# Patient Record
Sex: Male | Born: 1950 | Race: White | Hispanic: No | Marital: Married | Smoking: Never smoker
Health system: Southern US, Community
[De-identification: ages and names within clinical notes are randomized; demographics above are authoritative.]

## PROBLEM LIST (undated history)

## (undated) DIAGNOSIS — F419 Anxiety disorder, unspecified: Secondary | ICD-10-CM

## (undated) DIAGNOSIS — I10 Essential (primary) hypertension: Secondary | ICD-10-CM

## (undated) DIAGNOSIS — G629 Polyneuropathy, unspecified: Secondary | ICD-10-CM

## (undated) DIAGNOSIS — M5030 Other cervical disc degeneration, unspecified cervical region: Secondary | ICD-10-CM

## (undated) DIAGNOSIS — N189 Chronic kidney disease, unspecified: Secondary | ICD-10-CM

## (undated) DIAGNOSIS — D649 Anemia, unspecified: Secondary | ICD-10-CM

## (undated) DIAGNOSIS — R7303 Prediabetes: Secondary | ICD-10-CM

## (undated) DIAGNOSIS — E785 Hyperlipidemia, unspecified: Secondary | ICD-10-CM

## (undated) DIAGNOSIS — K760 Fatty (change of) liver, not elsewhere classified: Secondary | ICD-10-CM

## (undated) DIAGNOSIS — G4700 Insomnia, unspecified: Secondary | ICD-10-CM

## (undated) DIAGNOSIS — M109 Gout, unspecified: Secondary | ICD-10-CM

## (undated) HISTORY — DX: Insomnia, unspecified: G47.00

## (undated) HISTORY — DX: Other cervical disc degeneration, unspecified cervical region: M50.30

## (undated) HISTORY — DX: Polyneuropathy, unspecified: G62.9

---

## 2011-05-15 ENCOUNTER — Emergency Department (HOSPITAL_COMMUNITY): Payer: No Typology Code available for payment source

## 2011-05-15 ENCOUNTER — Emergency Department (HOSPITAL_COMMUNITY)
Admission: EM | Admit: 2011-05-15 | Discharge: 2011-05-15 | Disposition: A | Discharge status: Home / Self Care | Payer: No Typology Code available for payment source | Attending: Emergency Medicine | Admitting: Emergency Medicine

## 2011-05-15 ENCOUNTER — Emergency Department (HOSPITAL_COMMUNITY)
Admission: EM | Admit: 2011-05-15 | Discharge: 2011-05-16 | Disposition: A | Discharge status: Home / Self Care | Payer: No Typology Code available for payment source | Attending: Emergency Medicine | Admitting: Emergency Medicine

## 2011-05-15 DIAGNOSIS — S0180XA Unspecified open wound of other part of head, initial encounter: Secondary | ICD-10-CM | POA: Insufficient documentation

## 2011-05-15 DIAGNOSIS — S20219A Contusion of unspecified front wall of thorax, initial encounter: Secondary | ICD-10-CM | POA: Insufficient documentation

## 2011-05-15 DIAGNOSIS — R0789 Other chest pain: Secondary | ICD-10-CM | POA: Insufficient documentation

## 2011-05-15 DIAGNOSIS — I10 Essential (primary) hypertension: Secondary | ICD-10-CM | POA: Insufficient documentation

## 2011-05-15 DIAGNOSIS — M546 Pain in thoracic spine: Secondary | ICD-10-CM | POA: Insufficient documentation
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
## 2011-05-16 ENCOUNTER — Emergency Department (HOSPITAL_COMMUNITY)
Admission: EM | Admit: 2011-05-16 | Discharge: 2011-05-16 | Disposition: A | Discharge status: Home / Self Care | Payer: No Typology Code available for payment source | Source: Home / Self Care | Attending: Emergency Medicine | Admitting: Emergency Medicine

## 2011-05-16 DIAGNOSIS — I10 Essential (primary) hypertension: Secondary | ICD-10-CM | POA: Insufficient documentation

## 2011-05-16 DIAGNOSIS — Z09 Encounter for follow-up examination after completed treatment for conditions other than malignant neoplasm: Secondary | ICD-10-CM | POA: Insufficient documentation

## 2011-05-16 DIAGNOSIS — D699 Hemorrhagic condition, unspecified: Secondary | ICD-10-CM | POA: Insufficient documentation

## 2011-05-16 DIAGNOSIS — S0180XA Unspecified open wound of other part of head, initial encounter: Secondary | ICD-10-CM | POA: Insufficient documentation

## 2011-05-18 ENCOUNTER — Emergency Department (HOSPITAL_COMMUNITY)
Admission: EM | Admit: 2011-05-18 | Discharge: 2011-05-18 | Disposition: A | Discharge status: Home / Self Care | Payer: No Typology Code available for payment source | Attending: Emergency Medicine | Admitting: Emergency Medicine

## 2011-05-18 DIAGNOSIS — I10 Essential (primary) hypertension: Secondary | ICD-10-CM | POA: Insufficient documentation

## 2011-05-18 DIAGNOSIS — M549 Dorsalgia, unspecified: Secondary | ICD-10-CM | POA: Insufficient documentation

## 2011-05-18 DIAGNOSIS — S0510XA Contusion of eyeball and orbital tissues, unspecified eye, initial encounter: Secondary | ICD-10-CM | POA: Insufficient documentation

## 2011-05-18 DIAGNOSIS — M542 Cervicalgia: Secondary | ICD-10-CM | POA: Insufficient documentation

## 2011-05-18 DIAGNOSIS — Z09 Encounter for follow-up examination after completed treatment for conditions other than malignant neoplasm: Secondary | ICD-10-CM | POA: Insufficient documentation

## 2013-11-14 HISTORY — PX: KNEE SURGERY: SHX244

## 2015-11-11 ENCOUNTER — Ambulatory Visit: Payer: Self-pay | Admitting: *Deleted

## 2019-02-22 ENCOUNTER — Other Ambulatory Visit: Payer: Self-pay | Admitting: Nephrology

## 2019-02-22 DIAGNOSIS — N183 Chronic kidney disease, stage 3 unspecified: Secondary | ICD-10-CM

## 2019-03-27 ENCOUNTER — Ambulatory Visit
Admission: RE | Admit: 2019-03-27 | Discharge: 2019-03-27 | Disposition: A | Discharge status: Home / Self Care | Payer: Medicare Other | Source: Ambulatory Visit | Attending: Nephrology | Admitting: Nephrology

## 2019-03-27 DIAGNOSIS — N183 Chronic kidney disease, stage 3 unspecified: Secondary | ICD-10-CM

## 2019-03-27 IMAGING — US US RENAL
1 series · 14 of 25 positions shown · non-contrast
Comparison: None.

CLINICAL DATA: Chronic kidney disease, hypertension

EXAM:
RENAL / URINARY TRACT ULTRASOUND COMPLETE

[Series 1: us renal · 0.20mm/px · 14 of 35 slices shown]
[im 1/35]
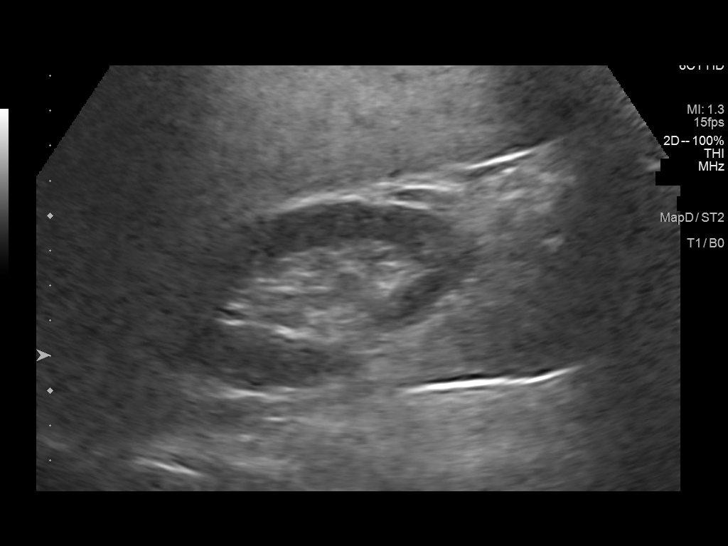
[im 3/35]
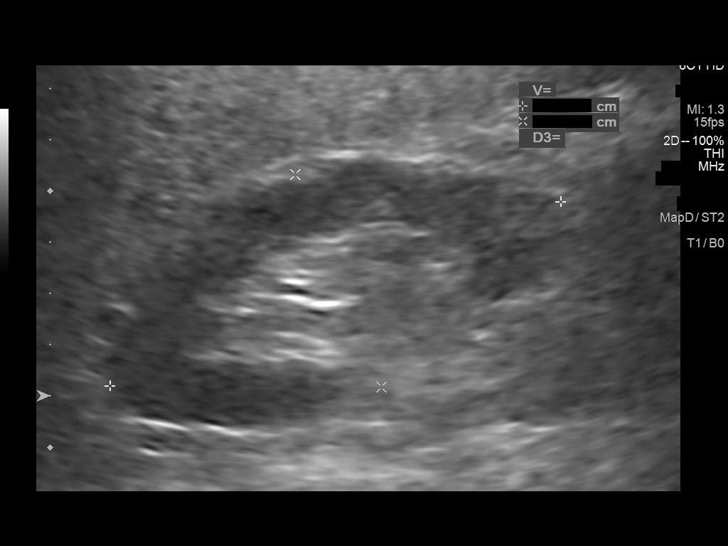
[im 6/35]
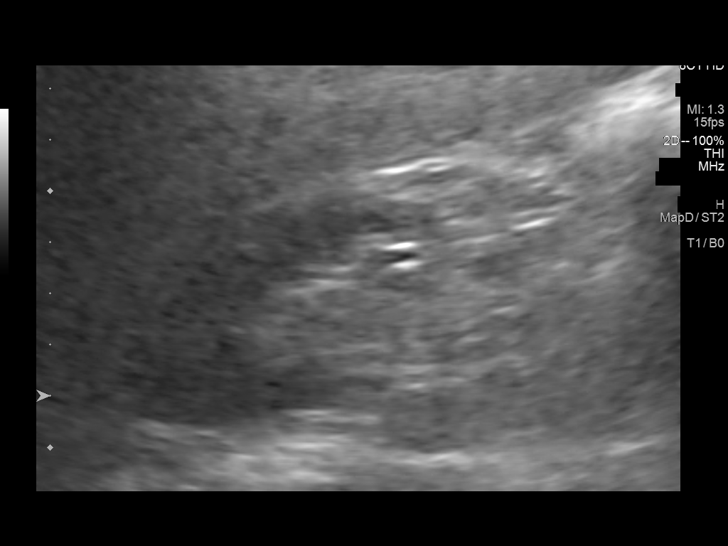
[im 9/35]
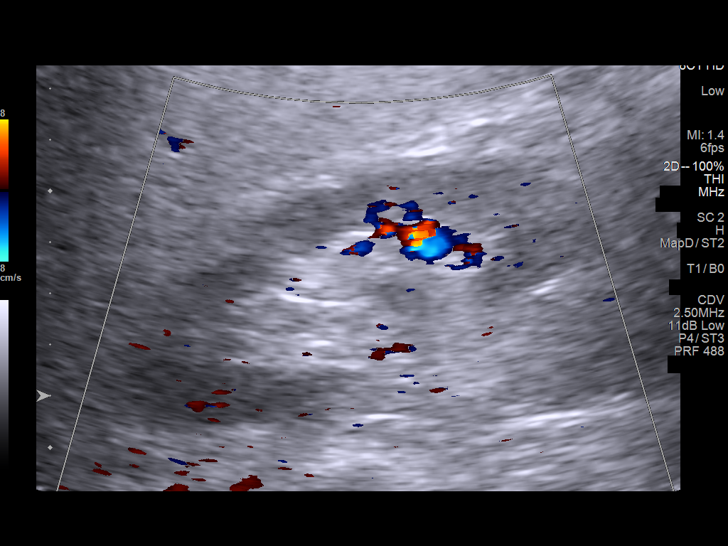
[im 12/35]
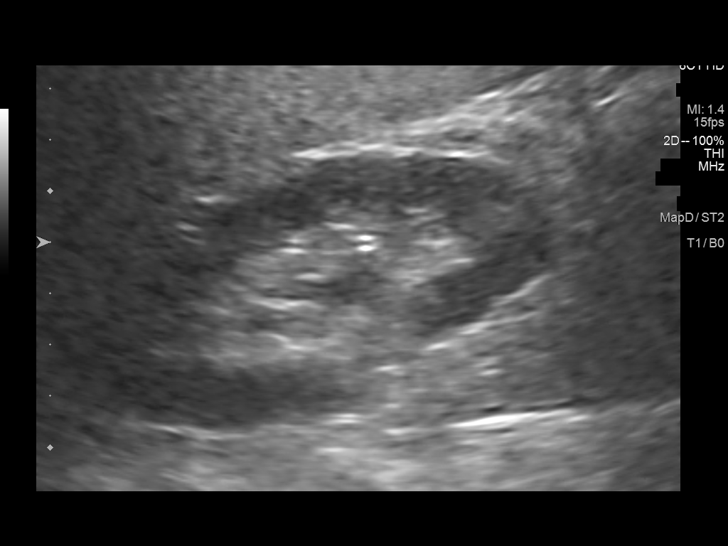
[im 13/35]
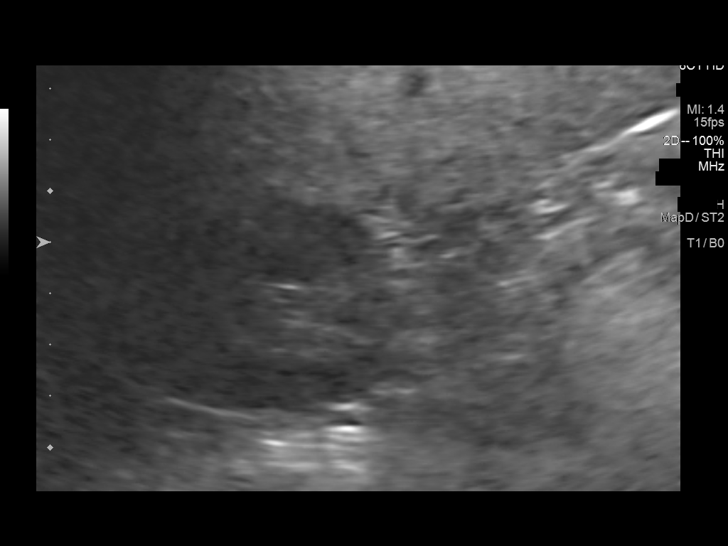
[im 16/35]
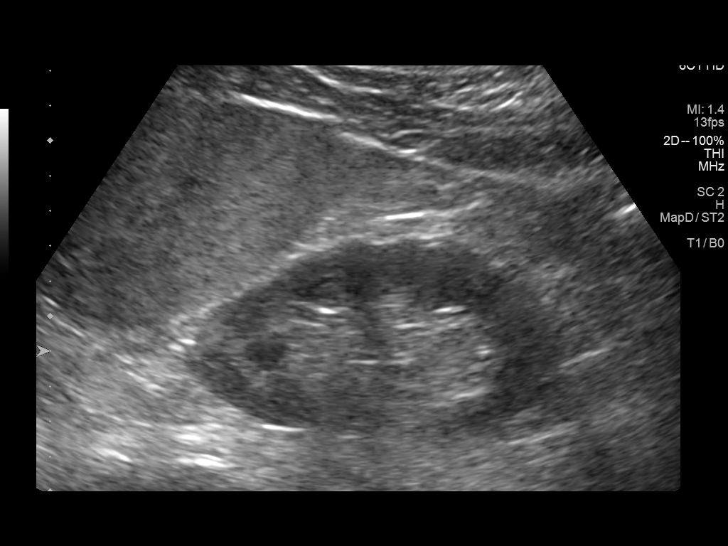
[im 19/35]
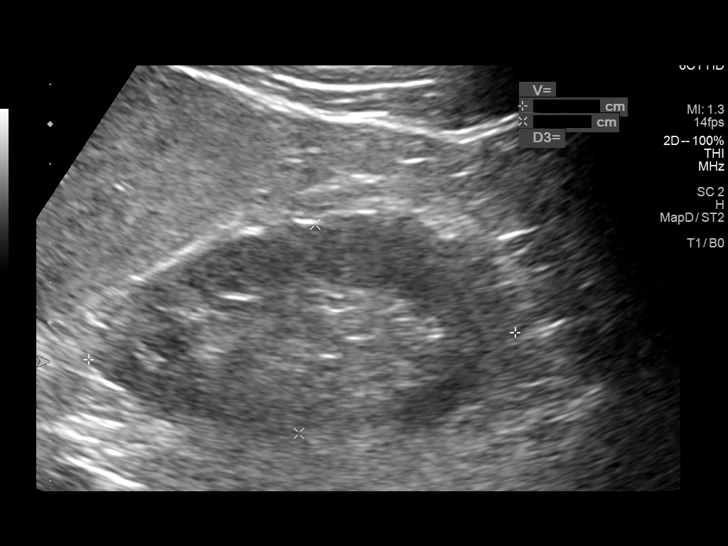
[im 22/35]
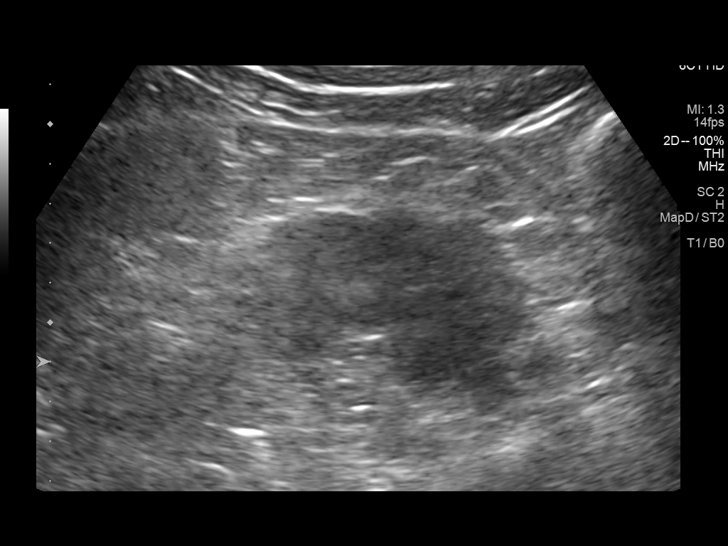
[im 23/35]
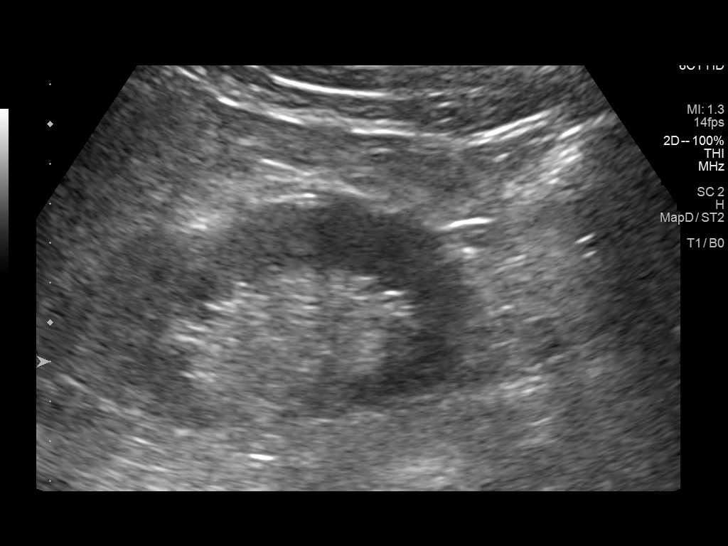
[im 26/35]
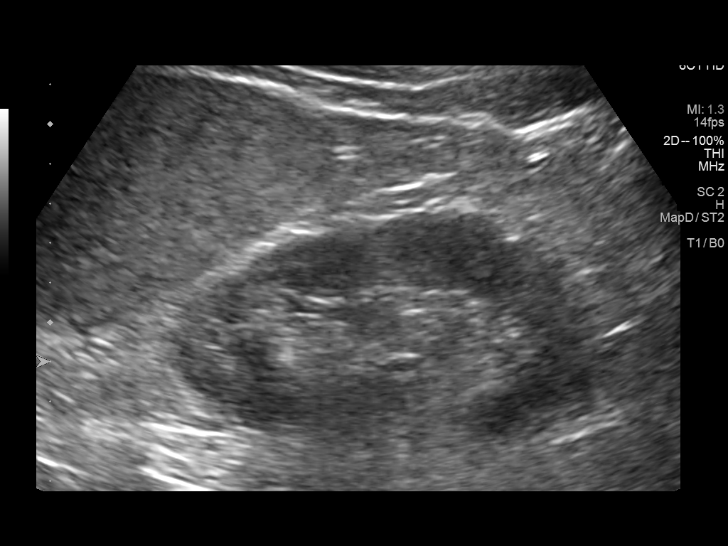
[im 29/35]
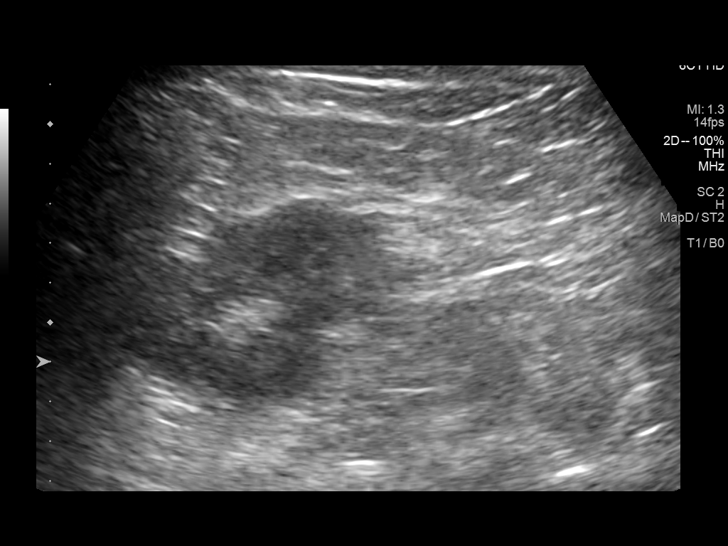
[im 32/35]
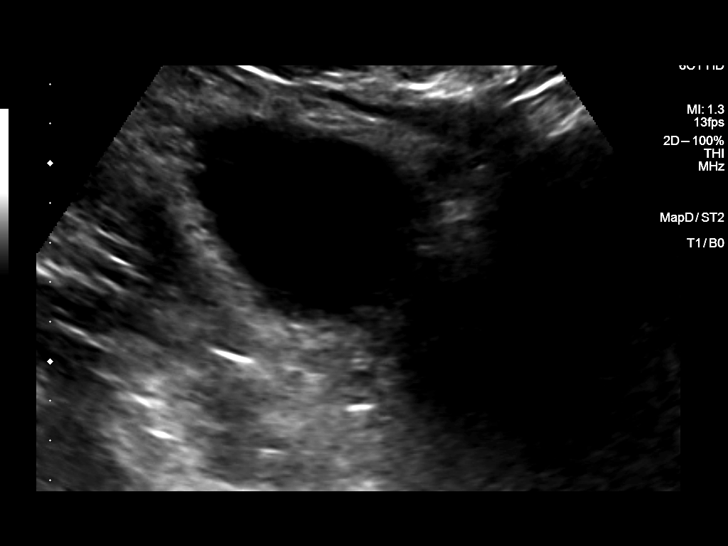
[im 35/35]
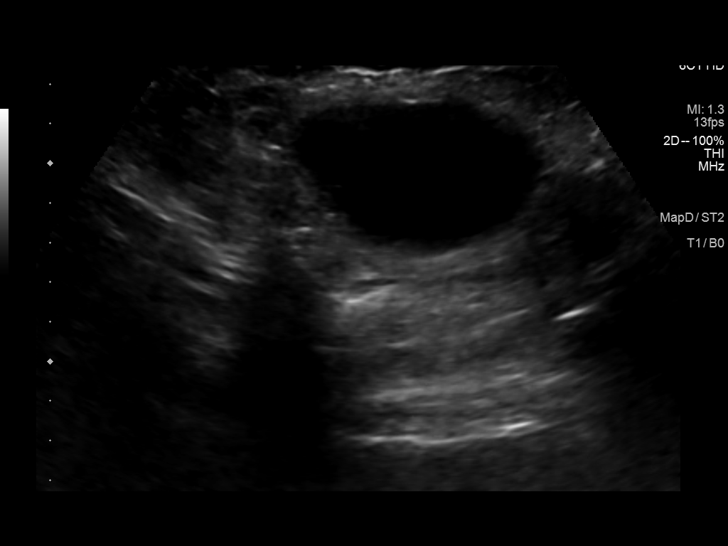

[14 of 25 positions shown; findings below may reference images not displayed]

FINDINGS: Right Kidney:

Renal measurements: 9.5 x 4.5 x 4.5 cm = volume: 101 mL .
Echogenicity within normal limits. No mass or hydronephrosis
visualized.

Left Kidney:

Renal measurements: 10.8 x 5.3 x 5.3 cm = volume: 157 mL.
Echogenicity within normal limits. No mass or hydronephrosis
visualized.

Bladder:

Appears normal for degree of bladder distention.
IMPRESSION: Normal renal ultrasound for age

## 2020-06-23 ENCOUNTER — Other Ambulatory Visit: Payer: Self-pay | Admitting: Orthopedic Surgery

## 2020-06-23 DIAGNOSIS — M7541 Impingement syndrome of right shoulder: Secondary | ICD-10-CM

## 2020-06-23 DIAGNOSIS — M25511 Pain in right shoulder: Secondary | ICD-10-CM

## 2020-06-26 ENCOUNTER — Other Ambulatory Visit: Payer: Self-pay

## 2020-06-26 ENCOUNTER — Ambulatory Visit
Admission: RE | Admit: 2020-06-26 | Discharge: 2020-06-26 | Disposition: A | Discharge status: Home / Self Care | Payer: Medicare Other | Source: Ambulatory Visit | Attending: Orthopedic Surgery | Admitting: Orthopedic Surgery

## 2020-06-26 DIAGNOSIS — M25511 Pain in right shoulder: Secondary | ICD-10-CM

## 2020-06-26 DIAGNOSIS — M7541 Impingement syndrome of right shoulder: Secondary | ICD-10-CM

## 2020-06-26 IMAGING — CR DG ORBITS FOR FOREIGN BODY
2 series · 2 of 2 positions shown · non-contrast
Comparison: None.

CLINICAL DATA: Metal working/exposure; clearance prior to MRI

EXAM:
ORBITS FOR FOREIGN BODY - 2 VIEW

[w orbit pa (1 of 2)]
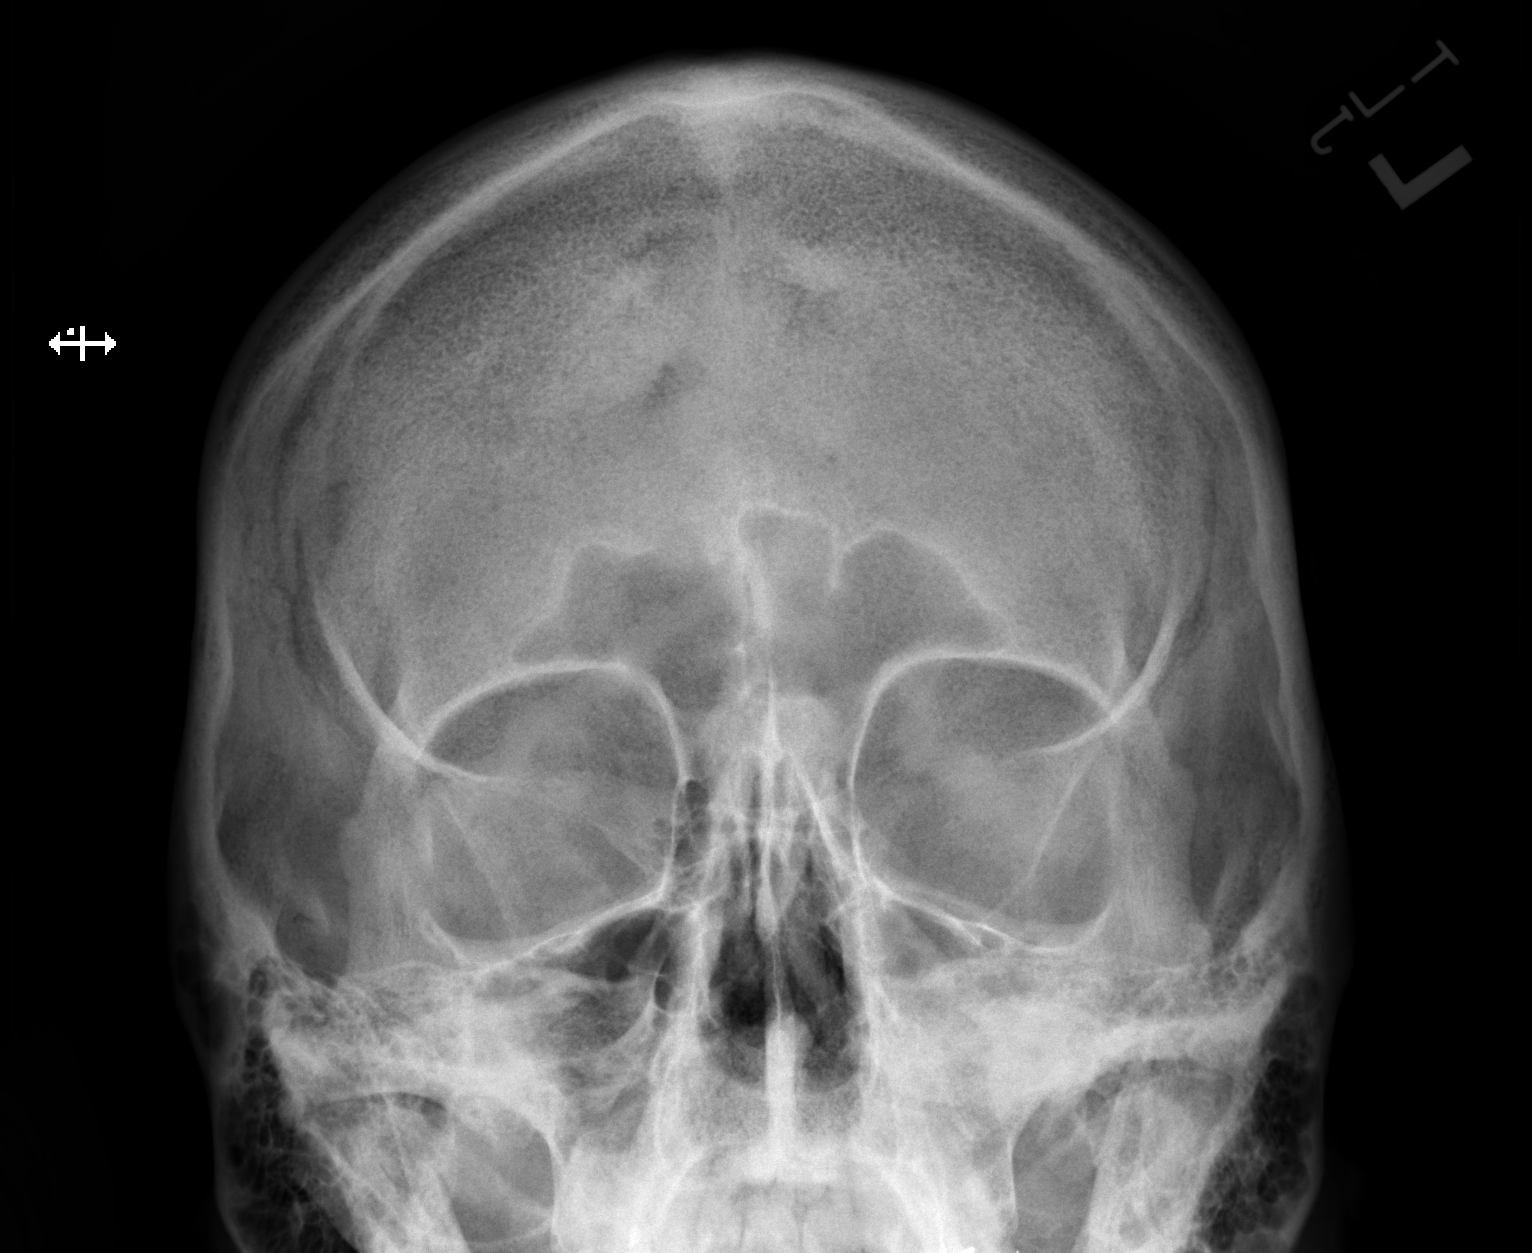

[w orbit pa (2 of 2)]
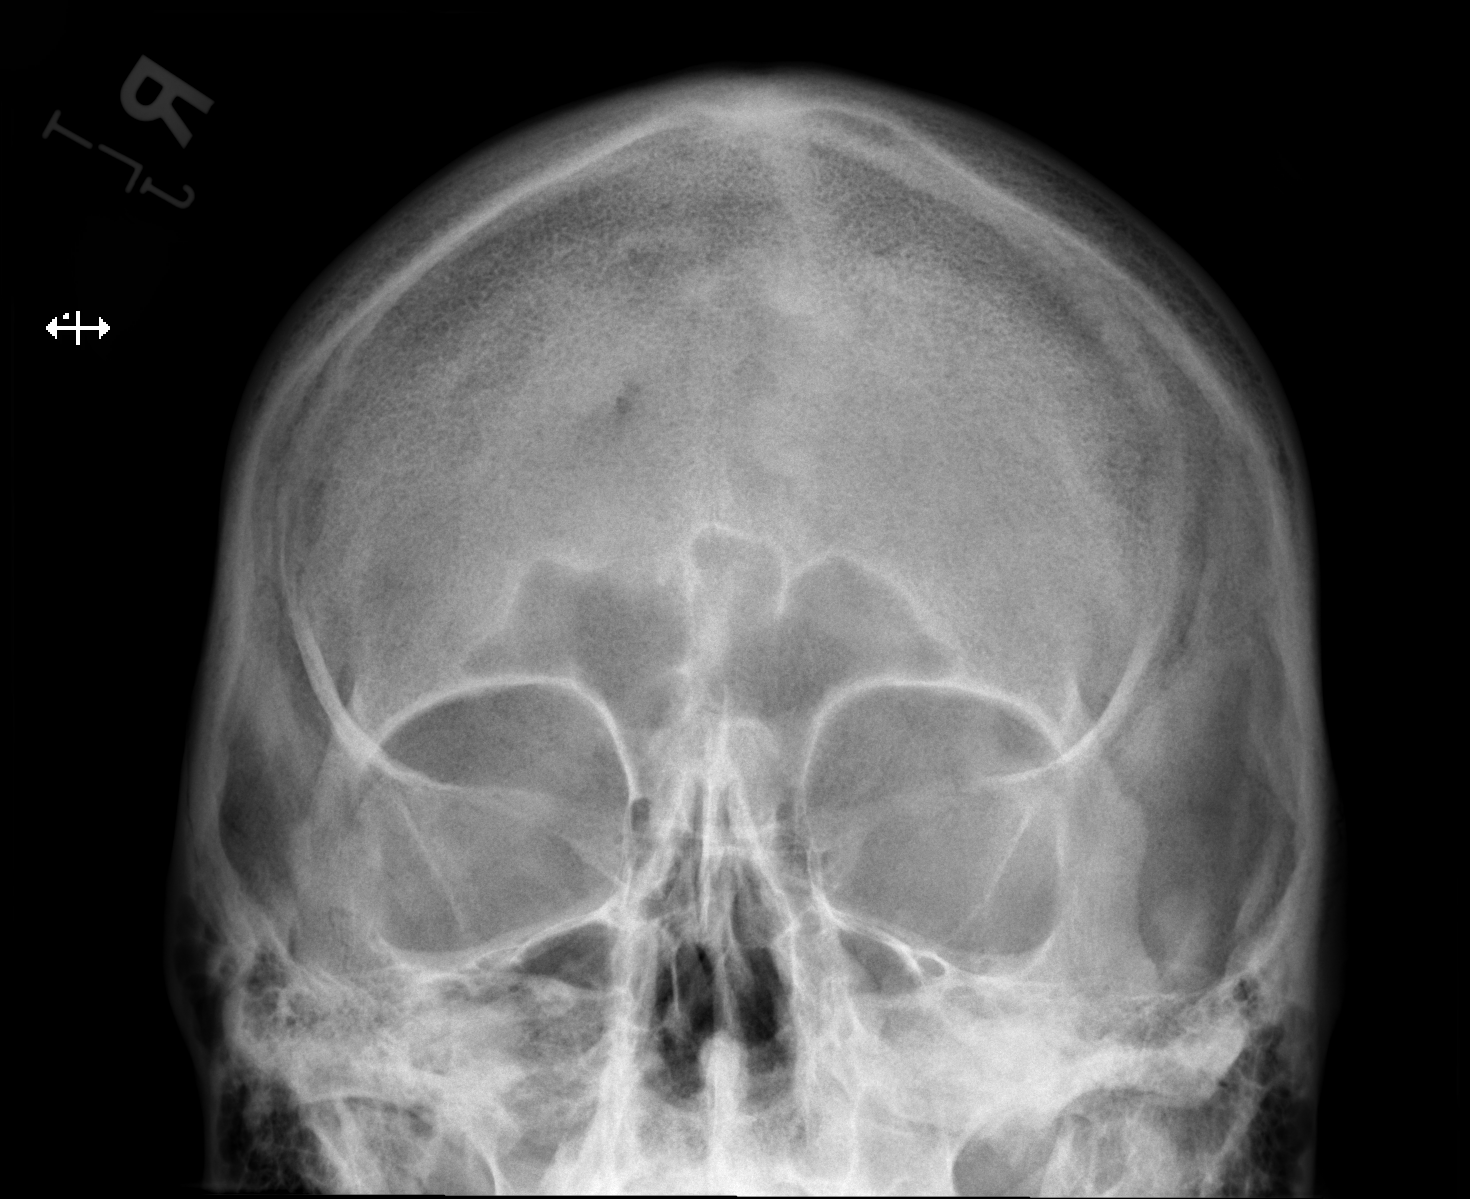

[2 of 2 positions shown; findings below may reference images not displayed]

FINDINGS: There is no evidence of metallic foreign body within the orbits.
Minimal opacification over the left maxillary sinus. Remainder the
exam is unremarkable.
IMPRESSION: No evidence of metallic foreign body within the orbits.

Suggestion of mild chronic inflammatory change of the left maxillary
sinus.

## 2020-06-28 ENCOUNTER — Ambulatory Visit
Admission: RE | Admit: 2020-06-28 | Discharge: 2020-06-28 | Disposition: A | Discharge status: Home / Self Care | Payer: Medicare Other | Source: Ambulatory Visit | Attending: Orthopedic Surgery | Admitting: Orthopedic Surgery

## 2020-06-28 DIAGNOSIS — M25511 Pain in right shoulder: Secondary | ICD-10-CM

## 2020-06-28 DIAGNOSIS — M7541 Impingement syndrome of right shoulder: Secondary | ICD-10-CM

## 2020-06-28 IMAGING — MR MR SHOULDER*R* W/O CM
4 of 5 series · 15 of 40 positions shown · non-contrast
Comparison: None.

CLINICAL DATA: Right shoulder pain. No known injury.

EXAM:
MRI OF THE RIGHT SHOULDER WITHOUT CONTRAST
TECHNIQUE: Multiplanar, multisequence MR imaging of the shoulder was performed.
No intravenous contrast was administered.

[Series 7: PD fat-sat · axial · right · 4.0mm · 0.22mm/px · z∈[-34,+57]mm · 6 of 20 slices shown (1 of 2)]
[im 1/20]
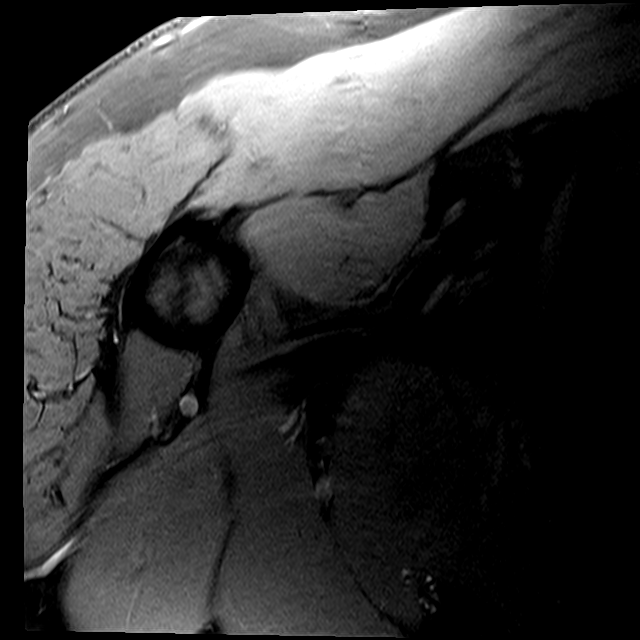
[im 4/20]
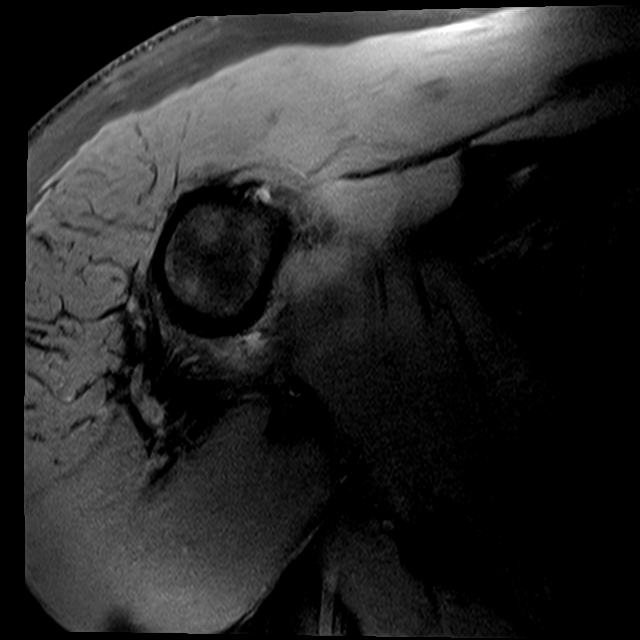
[im 8/20]
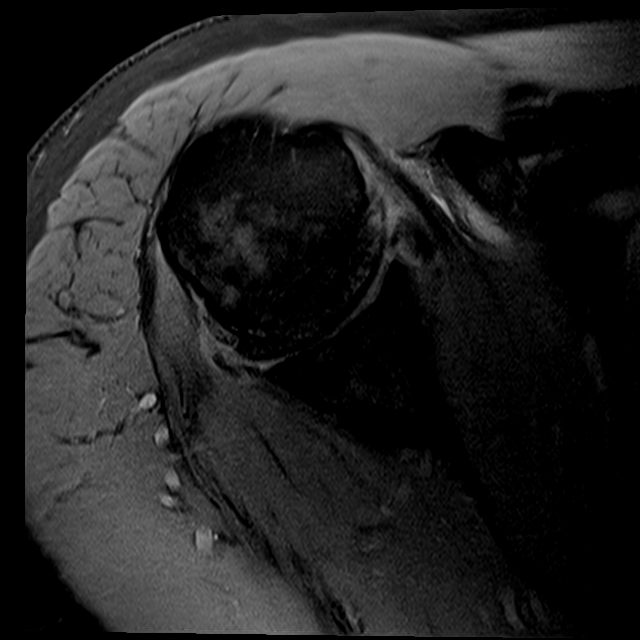
[im 12/20]
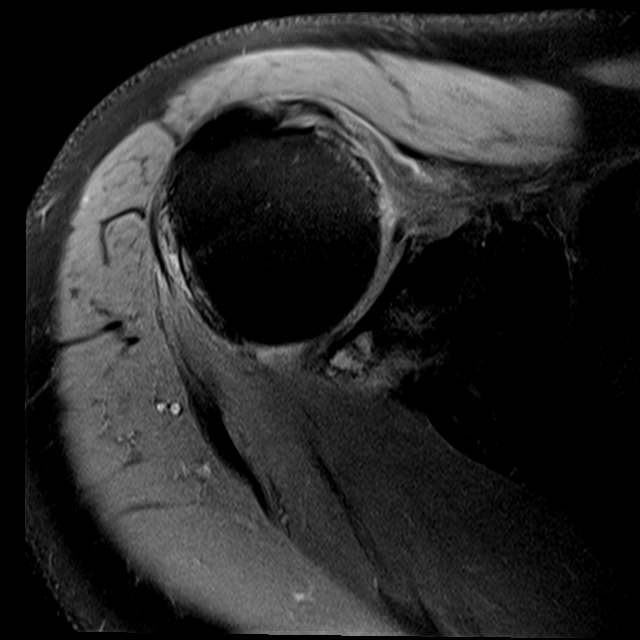
[im 16/20]
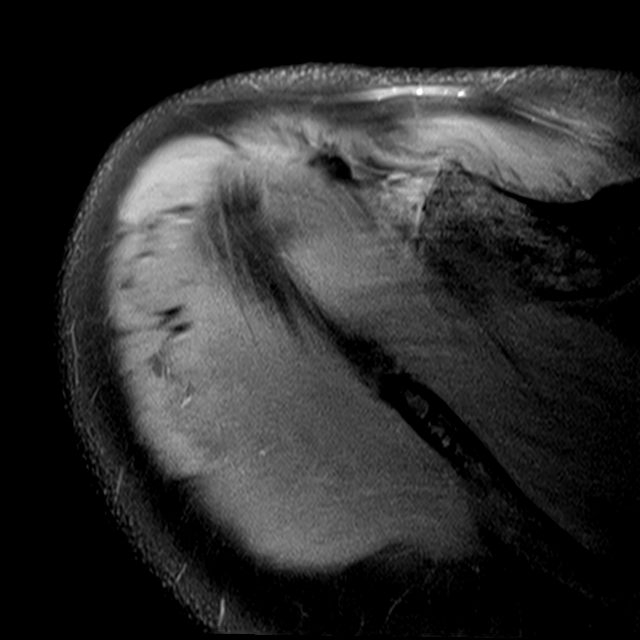
[im 20/20]
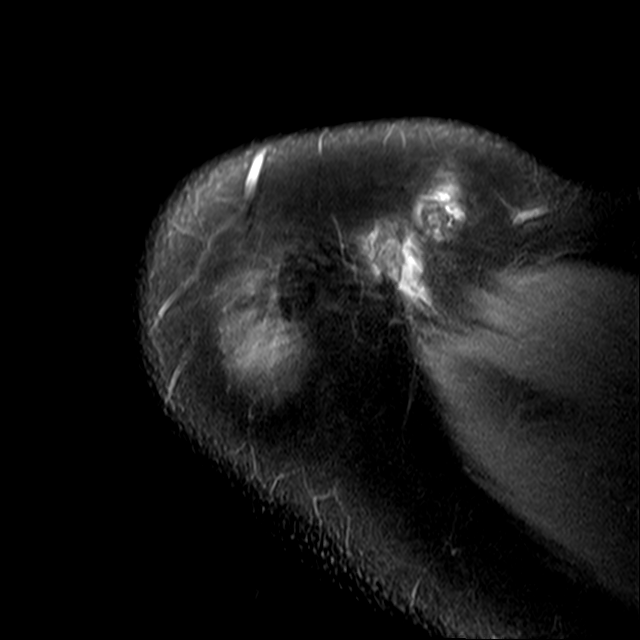

[Series 8: T2 fat-sat · oblique · right · 4.0mm · 0.22mm/px · 3 of 21 slices shown (1 of 2)]
[im 3/21]
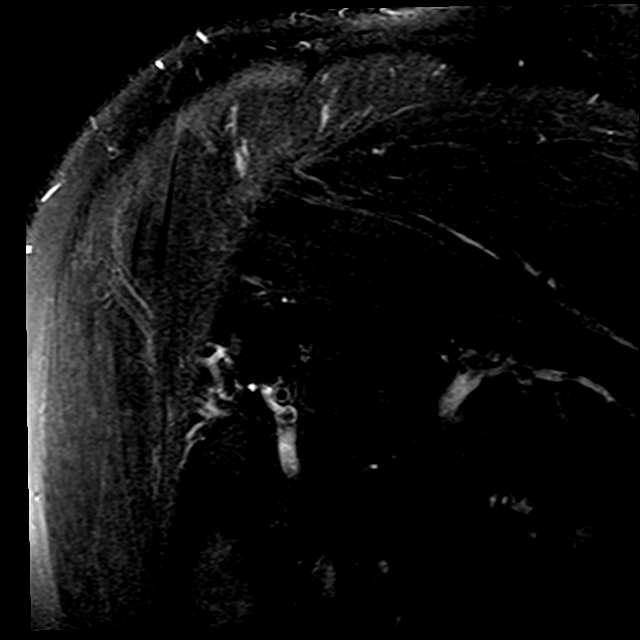
[im 12/21]
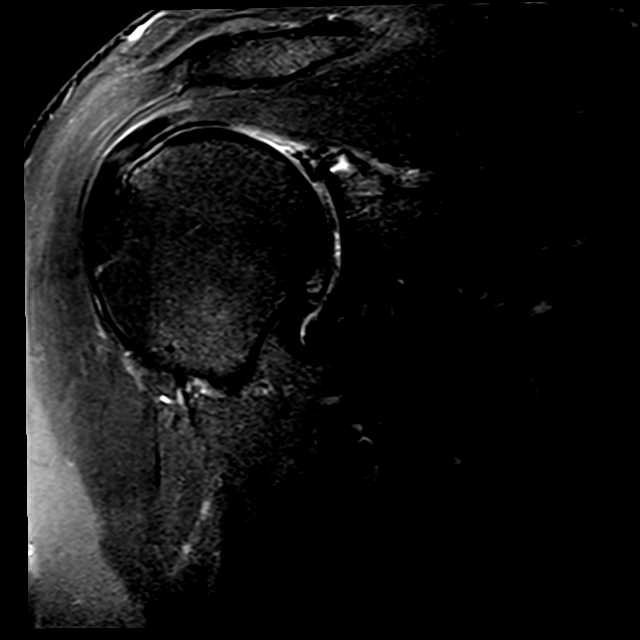
[im 18/21]
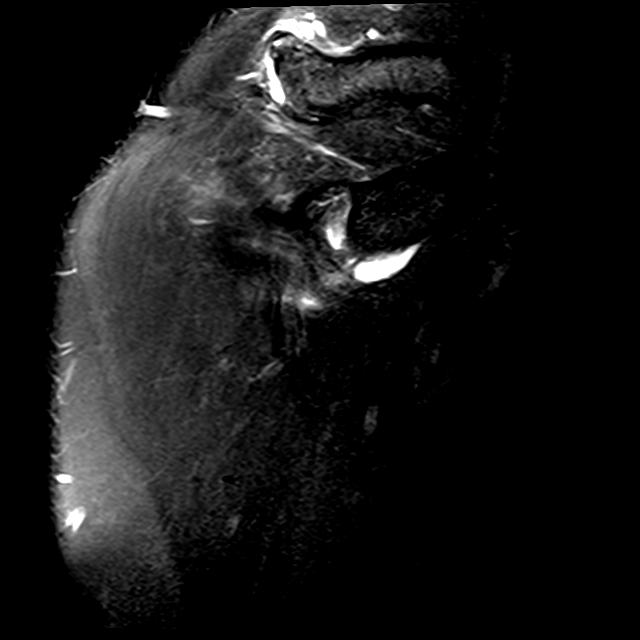

[Series 9: PD fat-sat · oblique · right · 4.0mm · 0.22mm/px · 3 of 21 slices shown (2 of 2)]
[im 3/21]
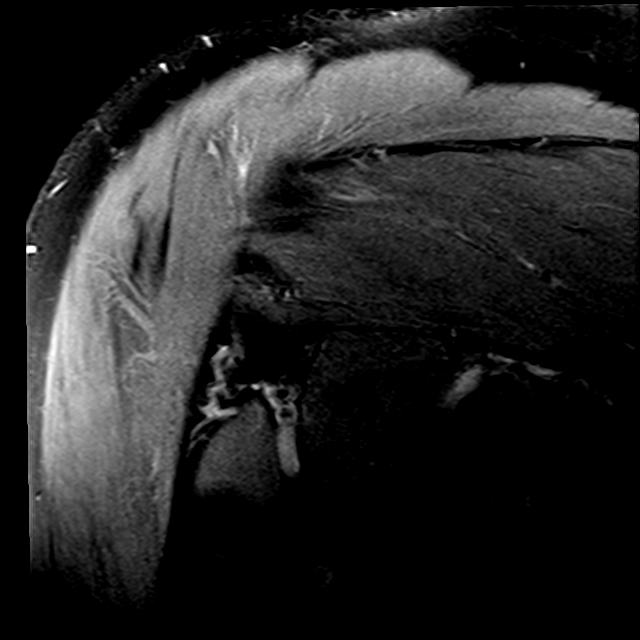
[im 12/21]
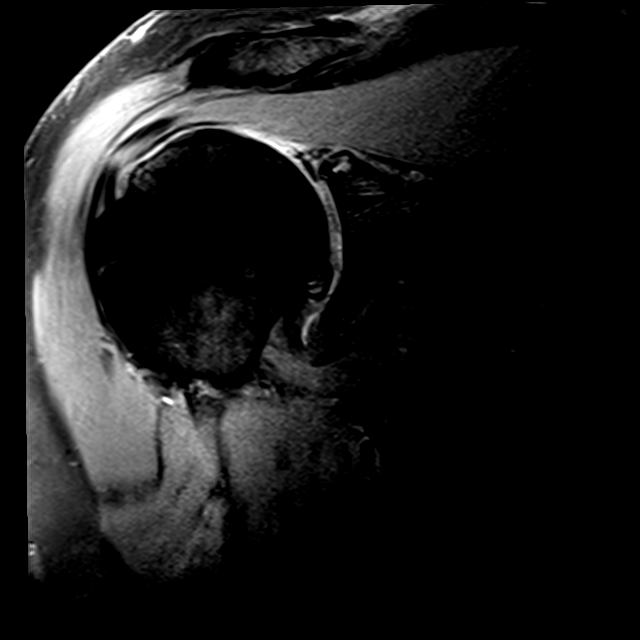
[im 18/21]
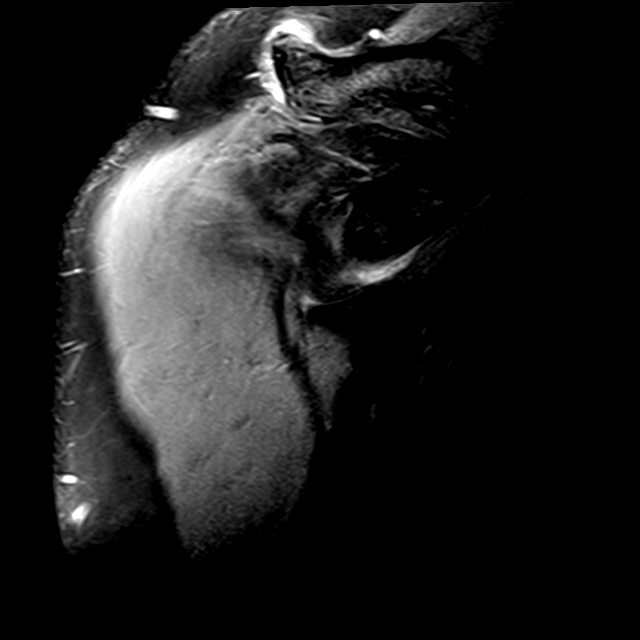

[Series 10: T2 fat-sat · oblique · right · 4.0mm · 0.44mm/px · 3 of 23 slices shown (2 of 2)]
[im 3/23]
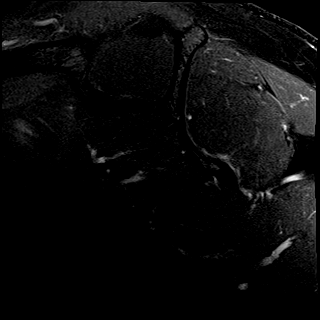
[im 12/23]
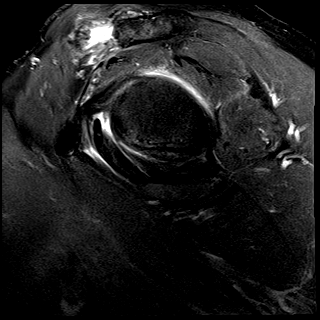
[im 20/23]
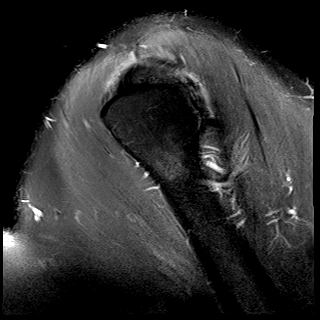

[15 of 40 positions shown; findings below may reference images not displayed]

FINDINGS: Rotator cuff: Mild tendinosis of the supraspinatus and infraspinatus
tendons. Teres minor tendon is intact. Subscapularis tendon is
intact.

Muscles: No muscle atrophy or edema. No intramuscular fluid
collection or hematoma.

Biceps Long Head: Oblique tear of the proximal aspect of the long
head of the biceps tendon.

Acromioclavicular Joint: Severe arthropathy of the acromioclavicular
joint. Type II acromion. No subacromial/subdeltoid bursal fluid.

Glenohumeral Joint: No joint effusion. High-grade partial-thickness
cartilage loss with areas of full-thickness cartilage loss of the
glenohumeral joint with inferior marginal osteophytes. Subchondral
cystic changes in the superior glenoid.

Labrum: Severe superior labral degeneration with a tear. Severe
degeneration of the posterior labrum with a tear.

Bones: No fracture or dislocation. No aggressive osseous lesion.

Other: No fluid collection or hematoma.
IMPRESSION: 1. Mild tendinosis of the supraspinatus and infraspinatus tendons.
2. Oblique tear of the proximal aspect of the long head of the
biceps tendon. Severe osteoarthritis of the glenohumeral joint.
Degeneration of the labrum with a superior and posterior labral
tear.

## 2021-03-18 ENCOUNTER — Emergency Department (HOSPITAL_COMMUNITY): Payer: Medicare Other

## 2021-03-18 ENCOUNTER — Inpatient Hospital Stay (HOSPITAL_COMMUNITY): Payer: Medicare Other

## 2021-03-18 ENCOUNTER — Other Ambulatory Visit: Payer: Self-pay

## 2021-03-18 ENCOUNTER — Inpatient Hospital Stay (HOSPITAL_COMMUNITY)
Admission: EM | Admit: 2021-03-18 | Discharge: 2021-03-20 | DRG: 091 | Disposition: A | Discharge status: Home / Self Care | Payer: Medicare Other | Attending: Internal Medicine | Admitting: Internal Medicine

## 2021-03-18 ENCOUNTER — Encounter (HOSPITAL_COMMUNITY): Payer: Self-pay | Admitting: Student

## 2021-03-18 DIAGNOSIS — G8929 Other chronic pain: Secondary | ICD-10-CM | POA: Diagnosis present

## 2021-03-18 DIAGNOSIS — Z823 Family history of stroke: Secondary | ICD-10-CM

## 2021-03-18 DIAGNOSIS — G9340 Encephalopathy, unspecified: Secondary | ICD-10-CM | POA: Diagnosis not present

## 2021-03-18 DIAGNOSIS — R823 Hemoglobinuria: Secondary | ICD-10-CM | POA: Diagnosis present

## 2021-03-18 DIAGNOSIS — M109 Gout, unspecified: Secondary | ICD-10-CM | POA: Diagnosis present

## 2021-03-18 DIAGNOSIS — R29700 NIHSS score 0: Secondary | ICD-10-CM | POA: Diagnosis present

## 2021-03-18 DIAGNOSIS — F419 Anxiety disorder, unspecified: Secondary | ICD-10-CM | POA: Diagnosis present

## 2021-03-18 DIAGNOSIS — M47812 Spondylosis without myelopathy or radiculopathy, cervical region: Secondary | ICD-10-CM | POA: Diagnosis present

## 2021-03-18 DIAGNOSIS — T148XXA Other injury of unspecified body region, initial encounter: Secondary | ICD-10-CM | POA: Diagnosis present

## 2021-03-18 DIAGNOSIS — R4182 Altered mental status, unspecified: Secondary | ICD-10-CM | POA: Diagnosis present

## 2021-03-18 DIAGNOSIS — S0990XA Unspecified injury of head, initial encounter: Secondary | ICD-10-CM | POA: Diagnosis present

## 2021-03-18 DIAGNOSIS — E669 Obesity, unspecified: Secondary | ICD-10-CM | POA: Diagnosis present

## 2021-03-18 DIAGNOSIS — I639 Cerebral infarction, unspecified: Secondary | ICD-10-CM | POA: Diagnosis present

## 2021-03-18 DIAGNOSIS — I959 Hypotension, unspecified: Secondary | ICD-10-CM | POA: Diagnosis not present

## 2021-03-18 DIAGNOSIS — R824 Acetonuria: Secondary | ICD-10-CM | POA: Diagnosis present

## 2021-03-18 DIAGNOSIS — D539 Nutritional anemia, unspecified: Secondary | ICD-10-CM | POA: Diagnosis present

## 2021-03-18 DIAGNOSIS — E785 Hyperlipidemia, unspecified: Secondary | ICD-10-CM | POA: Diagnosis present

## 2021-03-18 DIAGNOSIS — I459 Conduction disorder, unspecified: Secondary | ICD-10-CM | POA: Diagnosis present

## 2021-03-18 DIAGNOSIS — R21 Rash and other nonspecific skin eruption: Secondary | ICD-10-CM | POA: Diagnosis present

## 2021-03-18 DIAGNOSIS — M6282 Rhabdomyolysis: Secondary | ICD-10-CM | POA: Diagnosis present

## 2021-03-18 DIAGNOSIS — Z20822 Contact with and (suspected) exposure to covid-19: Secondary | ICD-10-CM | POA: Diagnosis present

## 2021-03-18 DIAGNOSIS — I129 Hypertensive chronic kidney disease with stage 1 through stage 4 chronic kidney disease, or unspecified chronic kidney disease: Secondary | ICD-10-CM | POA: Diagnosis present

## 2021-03-18 DIAGNOSIS — W57XXXA Bitten or stung by nonvenomous insect and other nonvenomous arthropods, initial encounter: Secondary | ICD-10-CM | POA: Diagnosis present

## 2021-03-18 DIAGNOSIS — E860 Dehydration: Secondary | ICD-10-CM | POA: Diagnosis present

## 2021-03-18 DIAGNOSIS — R7401 Elevation of levels of liver transaminase levels: Secondary | ICD-10-CM | POA: Diagnosis not present

## 2021-03-18 DIAGNOSIS — M160 Bilateral primary osteoarthritis of hip: Secondary | ICD-10-CM | POA: Diagnosis present

## 2021-03-18 DIAGNOSIS — N179 Acute kidney failure, unspecified: Secondary | ICD-10-CM | POA: Diagnosis present

## 2021-03-18 DIAGNOSIS — D649 Anemia, unspecified: Secondary | ICD-10-CM | POA: Diagnosis not present

## 2021-03-18 DIAGNOSIS — E872 Acidosis: Secondary | ICD-10-CM | POA: Diagnosis present

## 2021-03-18 DIAGNOSIS — I440 Atrioventricular block, first degree: Secondary | ICD-10-CM | POA: Diagnosis present

## 2021-03-18 DIAGNOSIS — W19XXXA Unspecified fall, initial encounter: Secondary | ICD-10-CM | POA: Diagnosis present

## 2021-03-18 DIAGNOSIS — W57XXXD Bitten or stung by nonvenomous insect and other nonvenomous arthropods, subsequent encounter: Secondary | ICD-10-CM | POA: Diagnosis not present

## 2021-03-18 DIAGNOSIS — G629 Polyneuropathy, unspecified: Secondary | ICD-10-CM | POA: Diagnosis present

## 2021-03-18 DIAGNOSIS — K760 Fatty (change of) liver, not elsewhere classified: Secondary | ICD-10-CM | POA: Diagnosis present

## 2021-03-18 DIAGNOSIS — R7989 Other specified abnormal findings of blood chemistry: Secondary | ICD-10-CM | POA: Diagnosis not present

## 2021-03-18 DIAGNOSIS — Z6832 Body mass index (BMI) 32.0-32.9, adult: Secondary | ICD-10-CM

## 2021-03-18 DIAGNOSIS — N1831 Chronic kidney disease, stage 3a: Secondary | ICD-10-CM | POA: Diagnosis present

## 2021-03-18 DIAGNOSIS — R7303 Prediabetes: Secondary | ICD-10-CM | POA: Diagnosis present

## 2021-03-18 DIAGNOSIS — S90561D Insect bite (nonvenomous), right ankle, subsequent encounter: Secondary | ICD-10-CM | POA: Diagnosis not present

## 2021-03-18 DIAGNOSIS — G928 Other toxic encephalopathy: Secondary | ICD-10-CM | POA: Diagnosis present

## 2021-03-18 HISTORY — DX: Gout, unspecified: M10.9

## 2021-03-18 HISTORY — DX: Anxiety disorder, unspecified: F41.9

## 2021-03-18 HISTORY — DX: Chronic kidney disease, unspecified: N18.9

## 2021-03-18 HISTORY — DX: Hyperlipidemia, unspecified: E78.5

## 2021-03-18 HISTORY — DX: Essential (primary) hypertension: I10

## 2021-03-18 HISTORY — DX: Fatty (change of) liver, not elsewhere classified: K76.0

## 2021-03-18 HISTORY — DX: Prediabetes: R73.03

## 2021-03-18 HISTORY — DX: Anemia, unspecified: D64.9

## 2021-03-18 LAB — BILIRUBIN, FRACTIONATED(TOT/DIR/INDIR)
Bilirubin, Direct: 0.3 mg/dL — ABNORMAL HIGH (ref 0.0–0.2)
Indirect Bilirubin: 1.2 mg/dL — ABNORMAL HIGH (ref 0.3–0.9)
Total Bilirubin: 1.5 mg/dL — ABNORMAL HIGH (ref 0.3–1.2)

## 2021-03-18 LAB — RAPID URINE DRUG SCREEN, HOSP PERFORMED
Amphetamines: NOT DETECTED
Barbiturates: NOT DETECTED
Benzodiazepines: POSITIVE — AB
Cocaine: NOT DETECTED
Opiates: POSITIVE — AB
Tetrahydrocannabinol: POSITIVE — AB

## 2021-03-18 LAB — URINALYSIS, ROUTINE W REFLEX MICROSCOPIC
Bilirubin Urine: NEGATIVE
Glucose, UA: NEGATIVE mg/dL
Ketones, ur: 5 mg/dL — AB
Leukocytes,Ua: NEGATIVE
Nitrite: NEGATIVE
Protein, ur: 30 mg/dL — AB
Specific Gravity, Urine: 1.014 (ref 1.005–1.030)
pH: 5 (ref 5.0–8.0)

## 2021-03-18 LAB — CBC WITH DIFFERENTIAL/PLATELET
Abs Immature Granulocytes: 0.04 10*3/uL (ref 0.00–0.07)
Basophils Absolute: 0 10*3/uL (ref 0.0–0.1)
Basophils Relative: 0 %
Eosinophils Absolute: 0.3 10*3/uL (ref 0.0–0.5)
Eosinophils Relative: 3 %
HCT: 39.3 % (ref 39.0–52.0)
Hemoglobin: 12.7 g/dL — ABNORMAL LOW (ref 13.0–17.0)
Immature Granulocytes: 1 %
Lymphocytes Relative: 11 %
Lymphs Abs: 1 10*3/uL (ref 0.7–4.0)
MCH: 33.1 pg (ref 26.0–34.0)
MCHC: 32.3 g/dL (ref 30.0–36.0)
MCV: 102.3 fL — ABNORMAL HIGH (ref 80.0–100.0)
Monocytes Absolute: 0.6 10*3/uL (ref 0.1–1.0)
Monocytes Relative: 7 %
Neutro Abs: 6.8 10*3/uL (ref 1.7–7.7)
Neutrophils Relative %: 78 %
Platelets: 213 10*3/uL (ref 150–400)
RBC: 3.84 MIL/uL — ABNORMAL LOW (ref 4.22–5.81)
RDW: 12.6 % (ref 11.5–15.5)
WBC: 8.8 10*3/uL (ref 4.0–10.5)
nRBC: 0 % (ref 0.0–0.2)

## 2021-03-18 LAB — COMPREHENSIVE METABOLIC PANEL
ALT: 108 U/L — ABNORMAL HIGH (ref 0–44)
AST: 261 U/L — ABNORMAL HIGH (ref 15–41)
Albumin: 4.2 g/dL (ref 3.5–5.0)
Alkaline Phosphatase: 89 U/L (ref 38–126)
Anion gap: 16 — ABNORMAL HIGH (ref 5–15)
BUN: 86 mg/dL — ABNORMAL HIGH (ref 8–23)
CO2: 21 mmol/L — ABNORMAL LOW (ref 22–32)
Calcium: 8.9 mg/dL (ref 8.9–10.3)
Chloride: 102 mmol/L (ref 98–111)
Creatinine, Ser: 6.52 mg/dL — ABNORMAL HIGH (ref 0.61–1.24)
GFR, Estimated: 9 mL/min — ABNORMAL LOW (ref >60)
Glucose, Bld: 95 mg/dL (ref 70–99)
Potassium: 4.6 mmol/L (ref 3.5–5.1)
Sodium: 139 mmol/L (ref 135–145)
Total Bilirubin: 1.3 mg/dL — ABNORMAL HIGH (ref 0.3–1.2)
Total Protein: 7.4 g/dL (ref 6.5–8.1)

## 2021-03-18 LAB — RETIC PANEL
Immature Retic Fract: 9.5 % (ref 2.3–15.9)
RBC.: 3.79 MIL/uL — ABNORMAL LOW (ref 4.22–5.81)
Retic Count, Absolute: 51.2 10*3/uL (ref 19.0–186.0)
Retic Ct Pct: 1.4 % (ref 0.4–3.1)
Reticulocyte Hemoglobin: 36.5 pg (ref >27.9)

## 2021-03-18 LAB — ETHANOL: Alcohol, Ethyl (B): <10 mg/dL (ref <10)

## 2021-03-18 LAB — RESP PANEL BY RT-PCR (FLU A&B, COVID) ARPGX2
Influenza A by PCR: NEGATIVE
Influenza B by PCR: NEGATIVE
SARS Coronavirus 2 by RT PCR: NEGATIVE

## 2021-03-18 LAB — SAVE SMEAR(SSMR), FOR PROVIDER SLIDE REVIEW

## 2021-03-18 LAB — CK: Total CK: 9562 U/L — ABNORMAL HIGH (ref 49–397)

## 2021-03-18 LAB — AMMONIA: Ammonia: 55 umol/L — ABNORMAL HIGH (ref 9–35)

## 2021-03-18 LAB — LACTATE DEHYDROGENASE: LDH: 407 U/L — ABNORMAL HIGH (ref 98–192)

## 2021-03-18 LAB — GAMMA GT: GGT: 103 U/L — ABNORMAL HIGH (ref 7–50)

## 2021-03-18 LAB — LACTIC ACID, PLASMA: Lactic Acid, Venous: 1.5 mmol/L (ref 0.5–1.9)

## 2021-03-18 IMAGING — US US RENAL
1 series · 14 of 25 positions shown · non-contrast
Comparison: [DATE]

CLINICAL DATA: Acute renal failure

EXAM:
RENAL / URINARY TRACT ULTRASOUND COMPLETE

[Series 1: us renal · 14 of 38 slices shown]
[im 1/38]
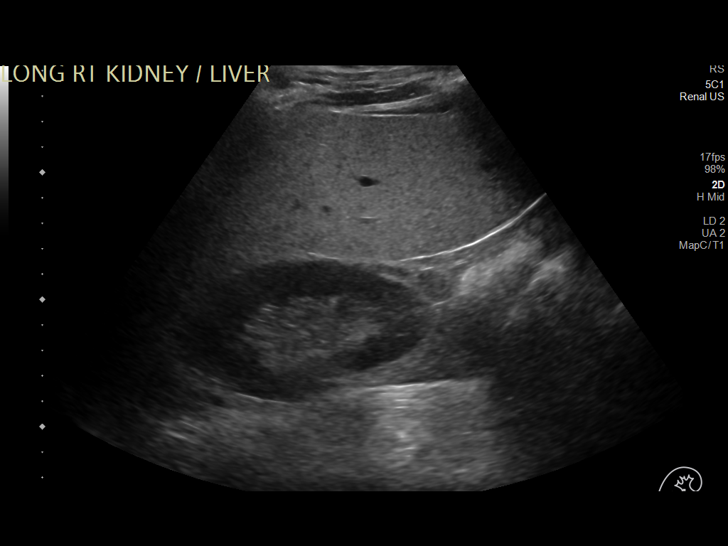
[im 4/38]
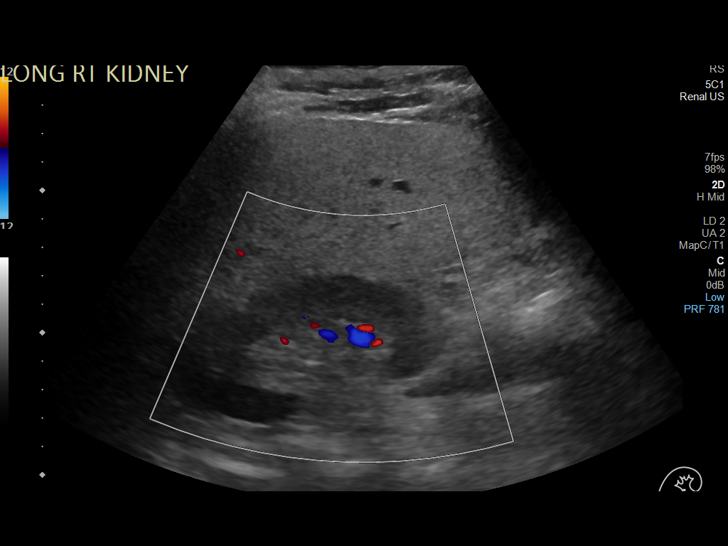
[im 7/38]
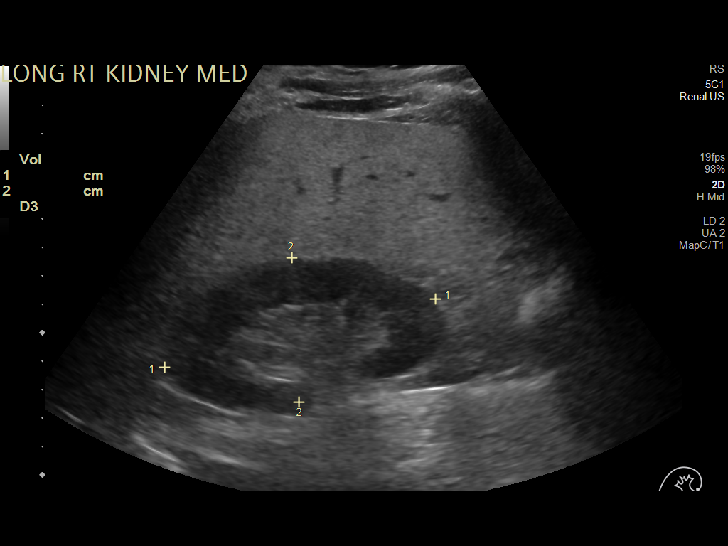
[im 10/38]
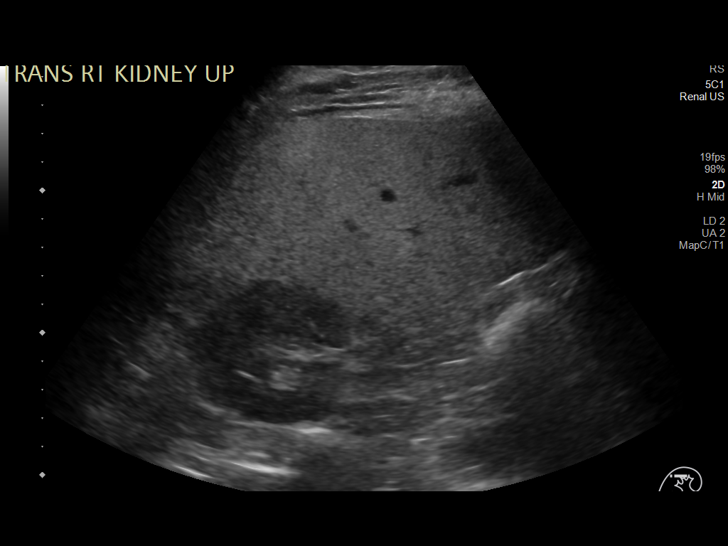
[im 13/38]
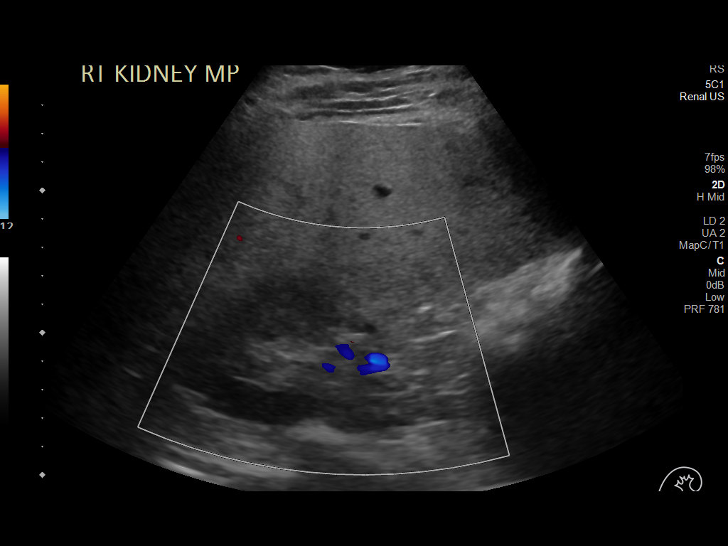
[im 14/38]
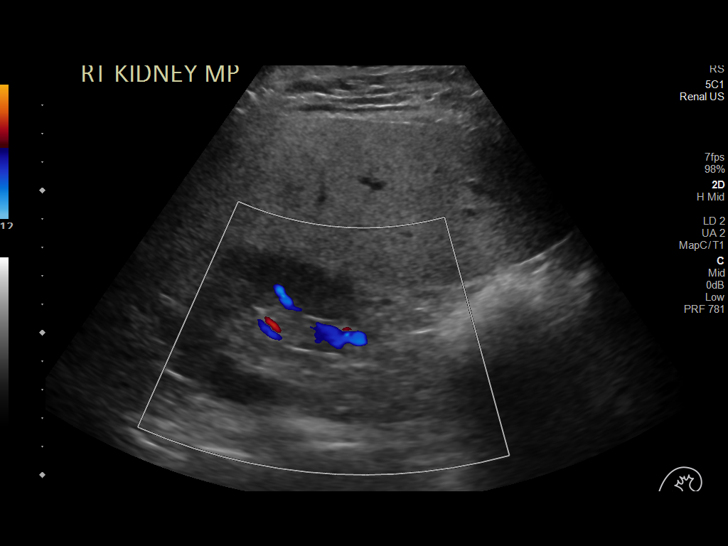
[im 17/38]
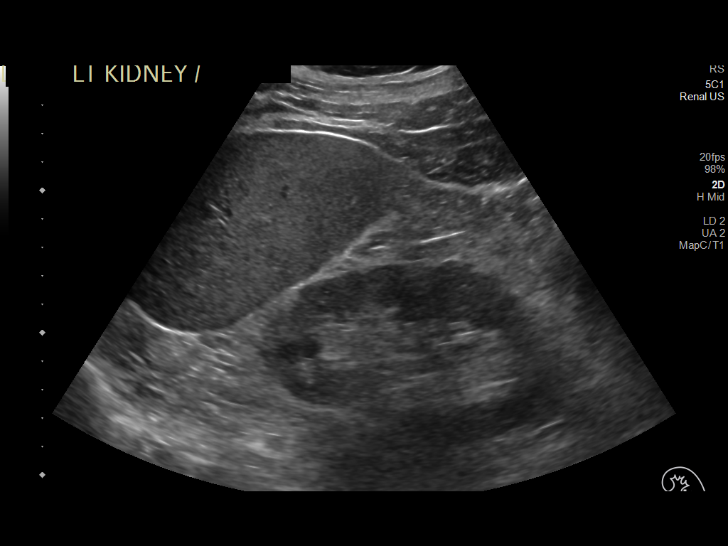
[im 21/38]
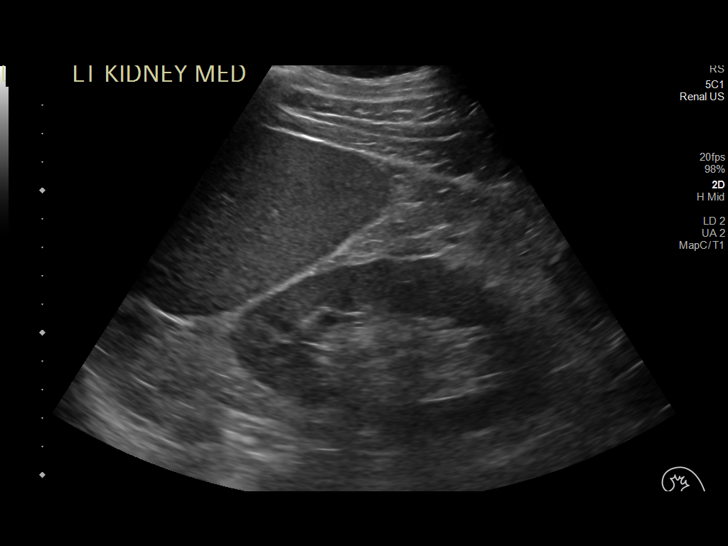
[im 24/38]
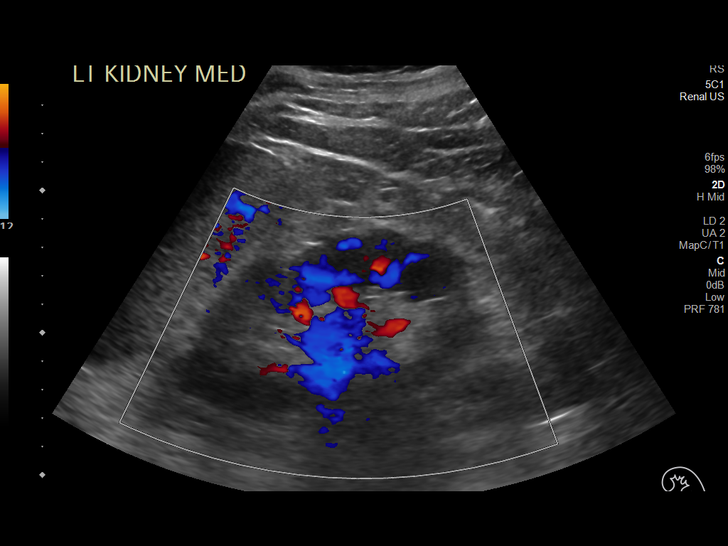
[im 25/38]
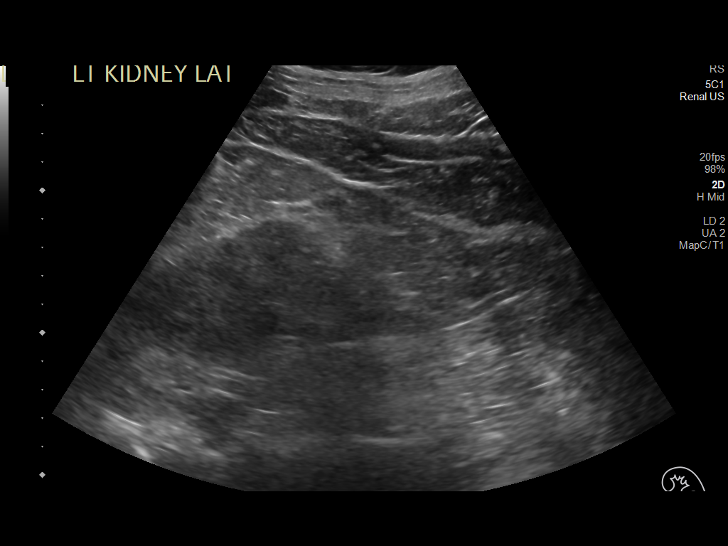
[im 28/38]
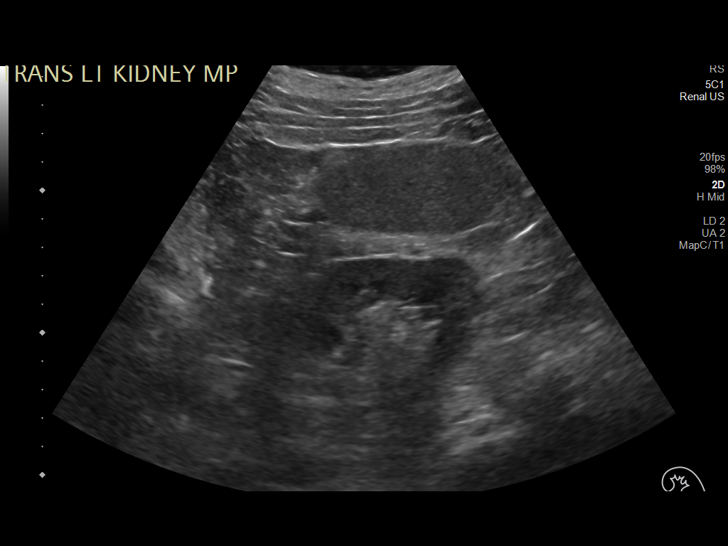
[im 31/38]
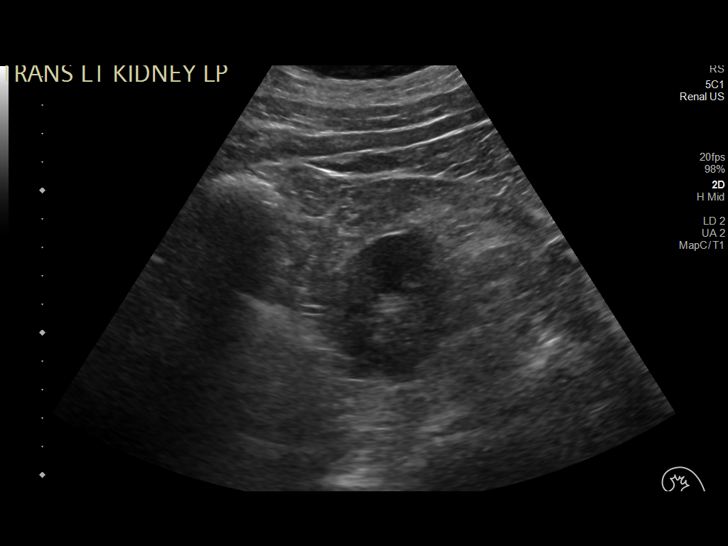
[im 34/38]
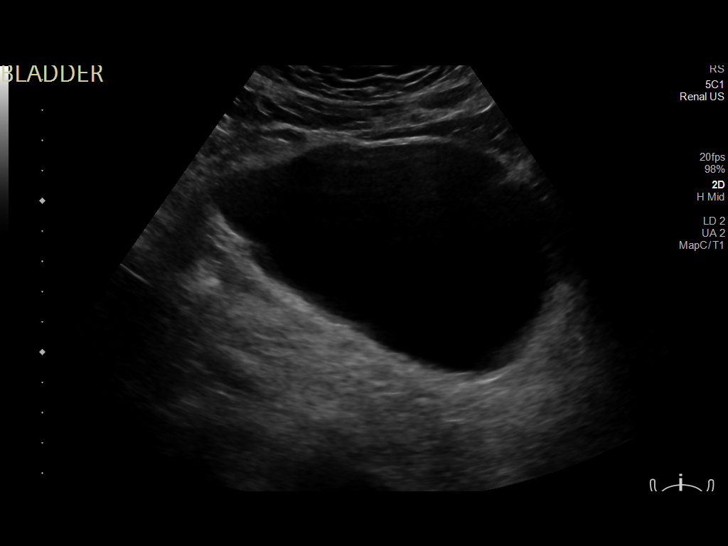
[im 38/38]
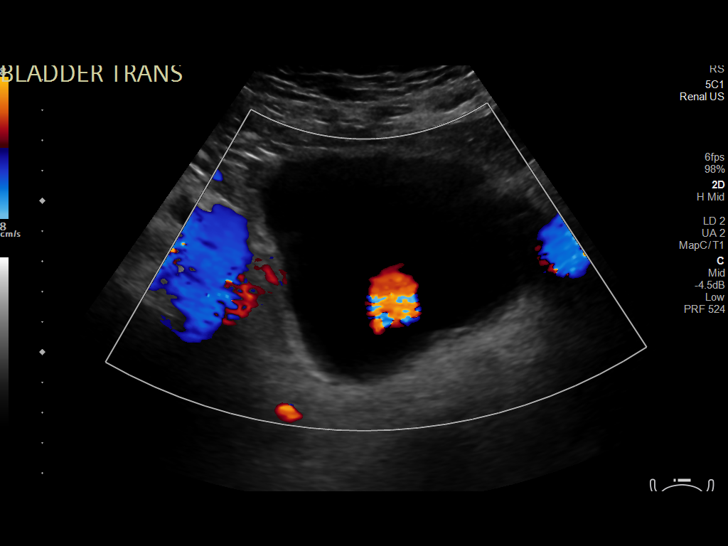

[14 of 25 positions shown; findings below may reference images not displayed]

FINDINGS: Right Kidney:

Renal measurements: 9.8 x 5.1 x 6.1 cm. = volume: 159 mL.
Echogenicity within normal limits. No mass or hydronephrosis
visualized.

Left Kidney:

Renal measurements: 11.2 x 6.3 x 5.5 cm. = volume: 203 mL.
Echogenicity within normal limits. No mass or hydronephrosis
visualized.

Bladder:

Appears normal for degree of bladder distention.

Other:

None.
IMPRESSION: No renal abnormality is noted bilaterally.

## 2021-03-18 IMAGING — CT CT HEAD W/O CM
4 series · 15 of 47 positions shown, 17 images · non-contrast
Comparison: None.

CLINICAL DATA: Recent fall 2 days ago with headaches and neck pain,
initial encounter

EXAM:
CT HEAD WITHOUT CONTRAST
CT CERVICAL SPINE WITHOUT CONTRAST
TECHNIQUE: Multidetector CT imaging of the head and cervical spine was
performed following the standard protocol without intravenous
contrast. Multiplanar CT image reconstructions of the cervical spine
were also generated.

[Series 3: head without · axial · non-contrast · 0.46mm/px · z∈[+1238,+1368]mm · 7 of 36 slices shown, 9 images]
[im 5/36  brain]
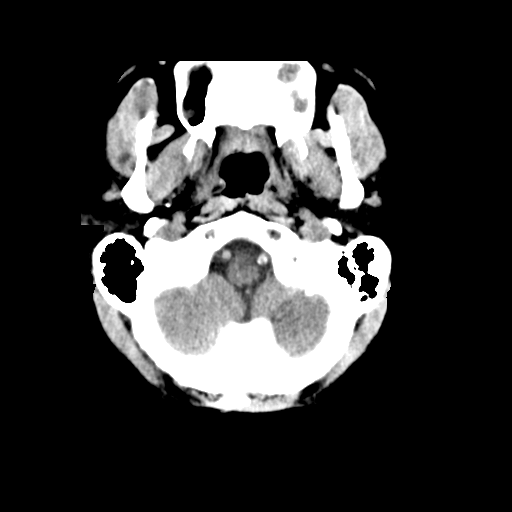
[im 5/36  bone]
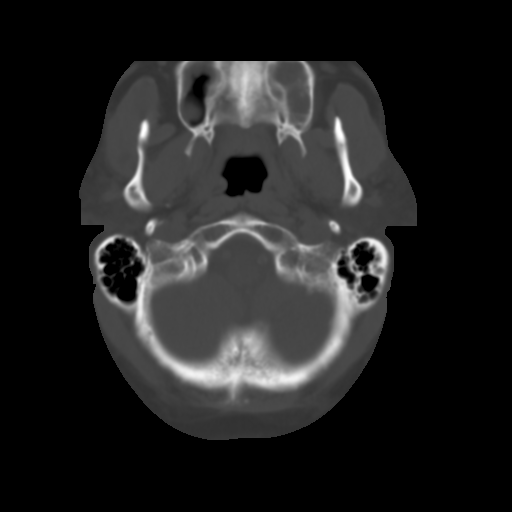
[im 9/36  brain]
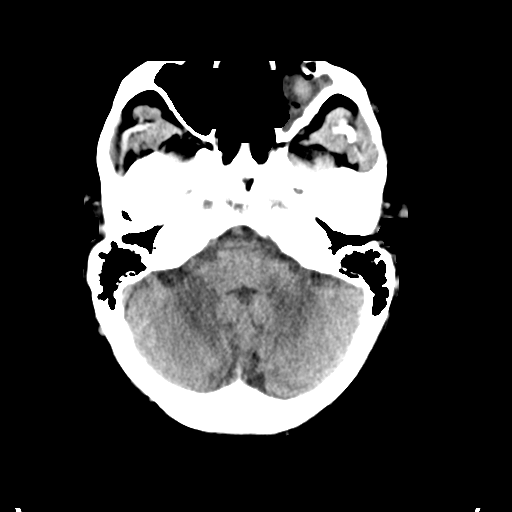
[im 14/36  brain]
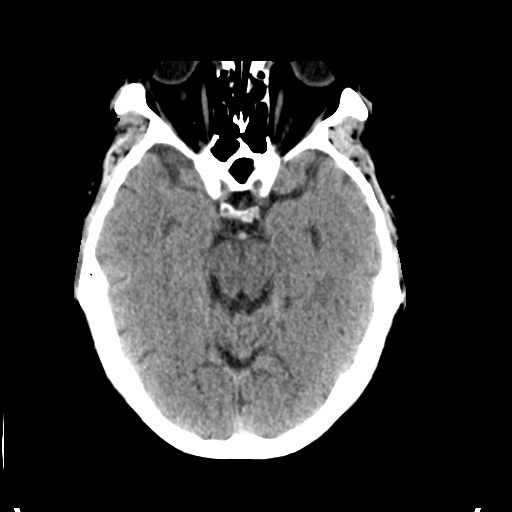
[im 18/36  brain]
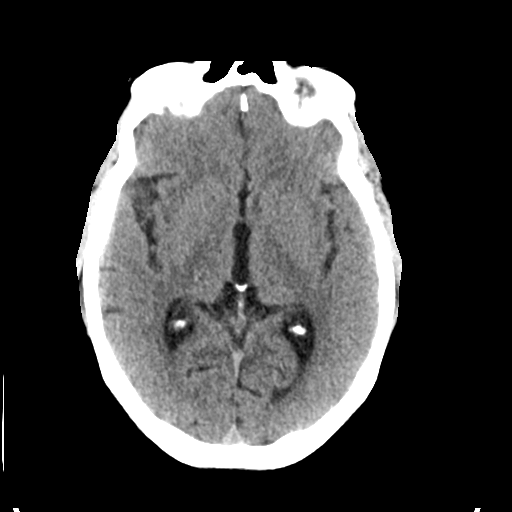
[im 22/36  brain]
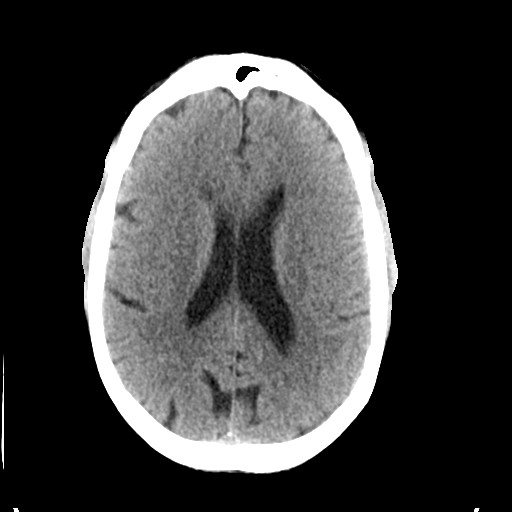
[im 22/36  bone]
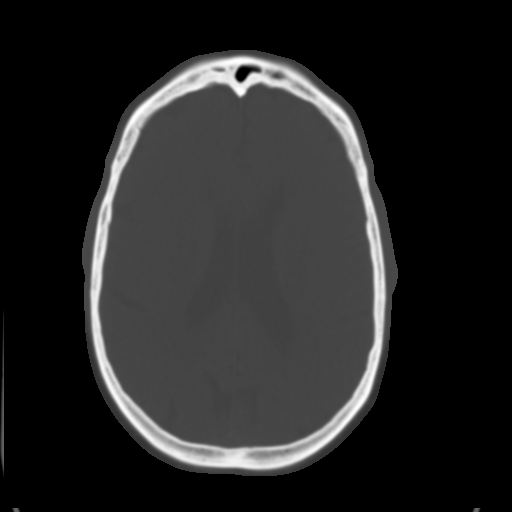
[im 27/36  brain]
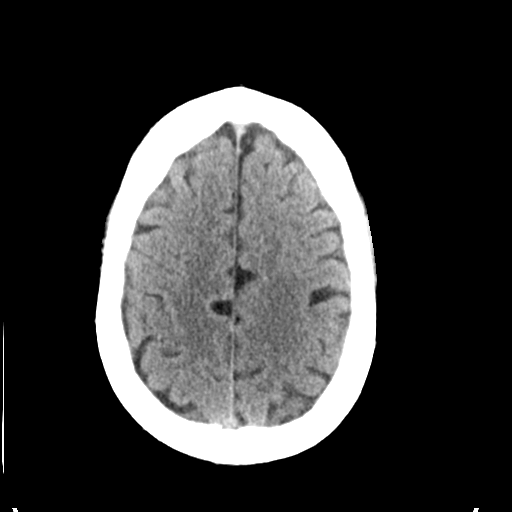
[im 31/36  brain]
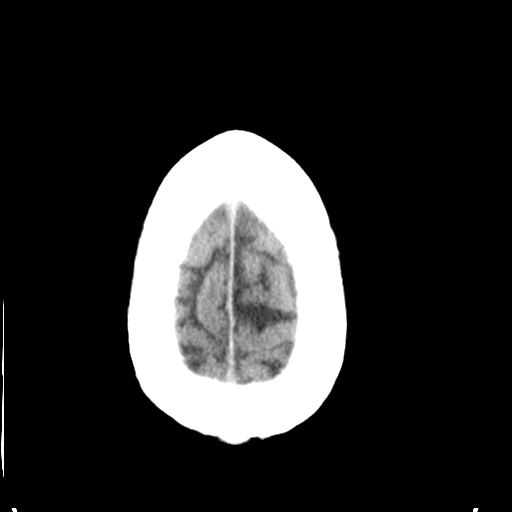

[Series 4: head bone · axial · 0.46mm/px · z∈[+1234,+1252]mm · 2 of 89 slices shown]
[im 9/89  bone]
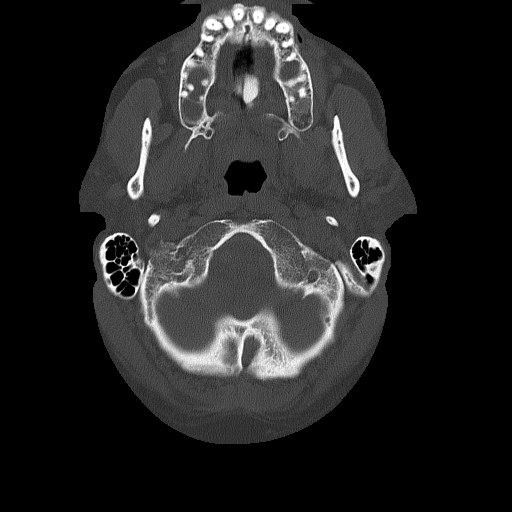
[im 18/89  bone]
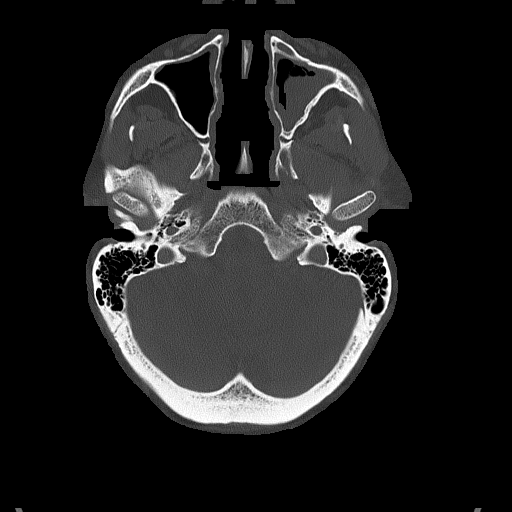

[Series 5: head without cor · coronal · non-contrast · 0.36mm/px · 3 of 81 slices shown]
[im 27/81  brain]
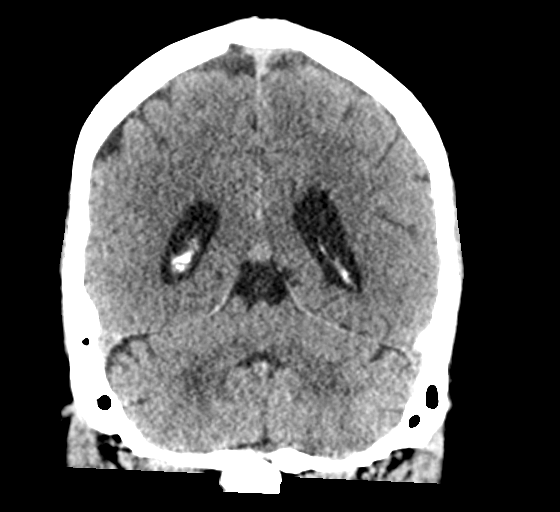
[im 36/81  brain]
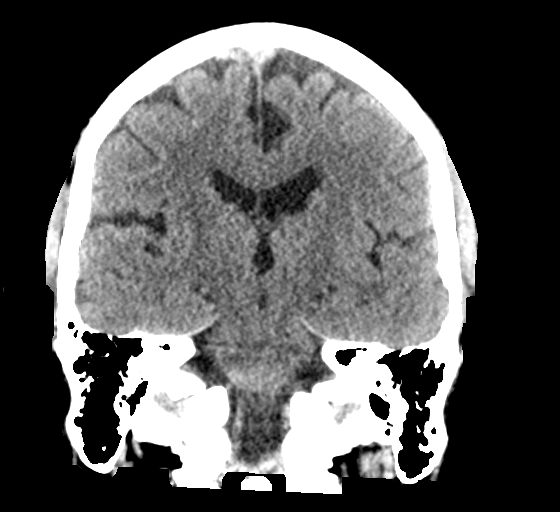
[im 45/81  brain]
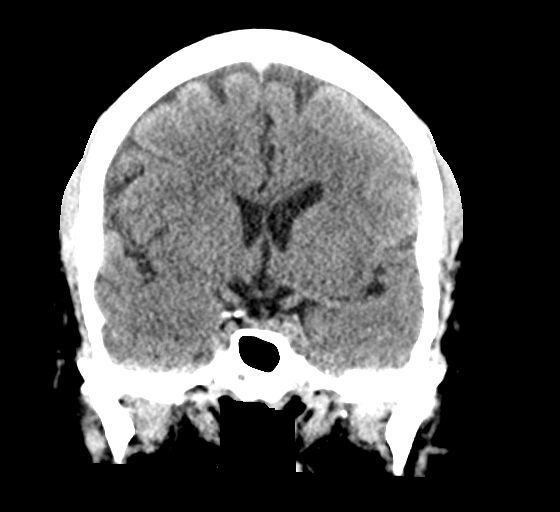

[Series 6: head without sag · sagittal · non-contrast · 0.35mm/px · 3 of 67 slices shown]
[im 23/67  brain]
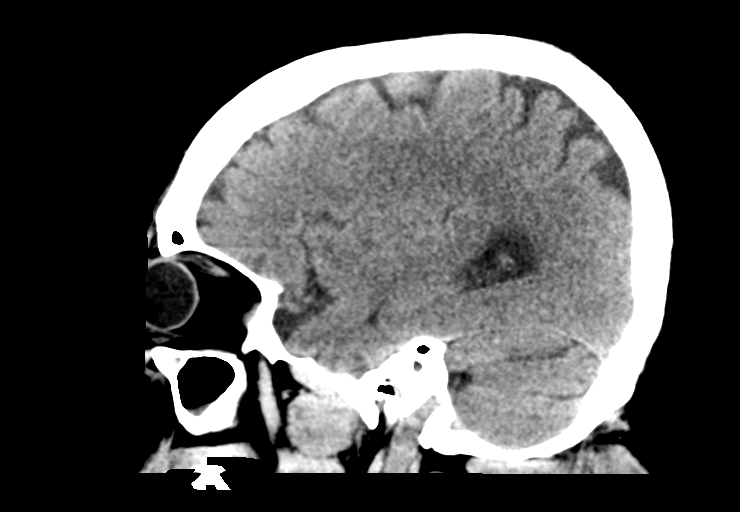
[im 34/67  brain]
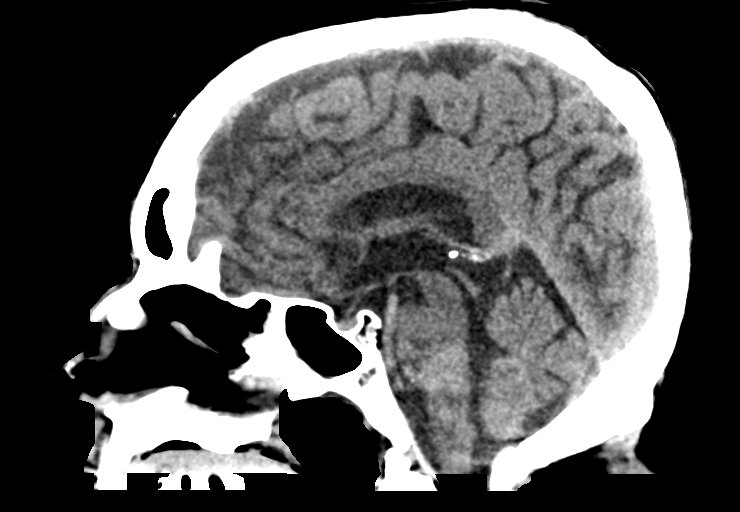
[im 45/67  brain]
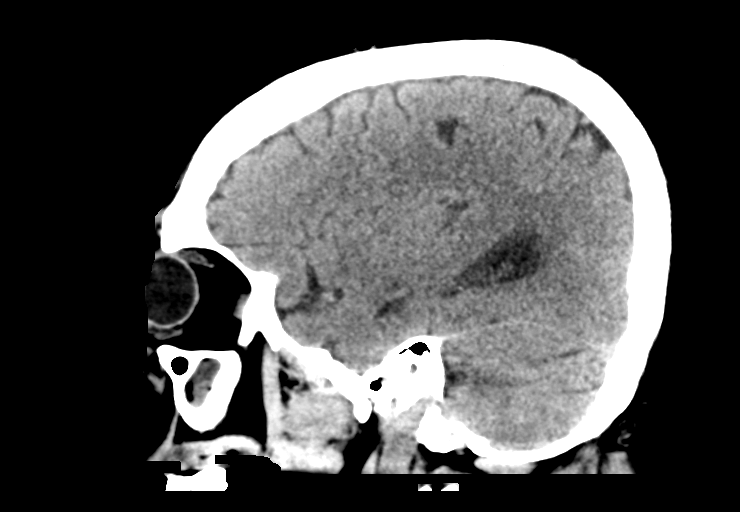

[15 of 47 positions shown; findings below may reference images not displayed]

FINDINGS: CT HEAD FINDINGS

Brain: No evidence of acute infarction, hemorrhage, hydrocephalus,
extra-axial collection or mass lesion/mass effect. Mild atrophic
changes are noted.

Vascular: No hyperdense vessel or unexpected calcification.

Skull: Normal. Negative for fracture or focal lesion.

Sinuses/Orbits: Mucosal thickening is noted within the left
maxillary antrum.

Other: Surrounding soft tissue structures are within normal limits.

CT CERVICAL SPINE FINDINGS

Alignment: Normal.

Skull base and vertebrae: Mild motion artifact is noted. Seven
cervical segments are well visualized. Vertebral body height is well
maintained. Disc space narrowing is noted from C3 to C7. Mild
osteophytic changes are seen at similar levels. Multilevel facet
hypertrophic changes are noted with associated neural foraminal
narrowing right greater than left. No acute fracture or acute facet
abnormality is seen.

Soft tissues and spinal canal: Surrounding soft tissue structures
are within normal limits.

Upper chest: Visualized lung apices are unremarkable.

Other: None
IMPRESSION: CT of the head: No acute intracranial abnormality is noted. Mucosal
thickening is noted within the left maxillary antrum.

CT of the cervical spine: Multilevel degenerative change without
acute abnormality.

## 2021-03-18 IMAGING — DX DG CHEST 1V PORT
1 series · 1 of 1 positions shown · non-contrast
Comparison: [DATE]

CLINICAL DATA: Altered mental status

EXAM:
PORTABLE CHEST 1 VIEW

[chest]
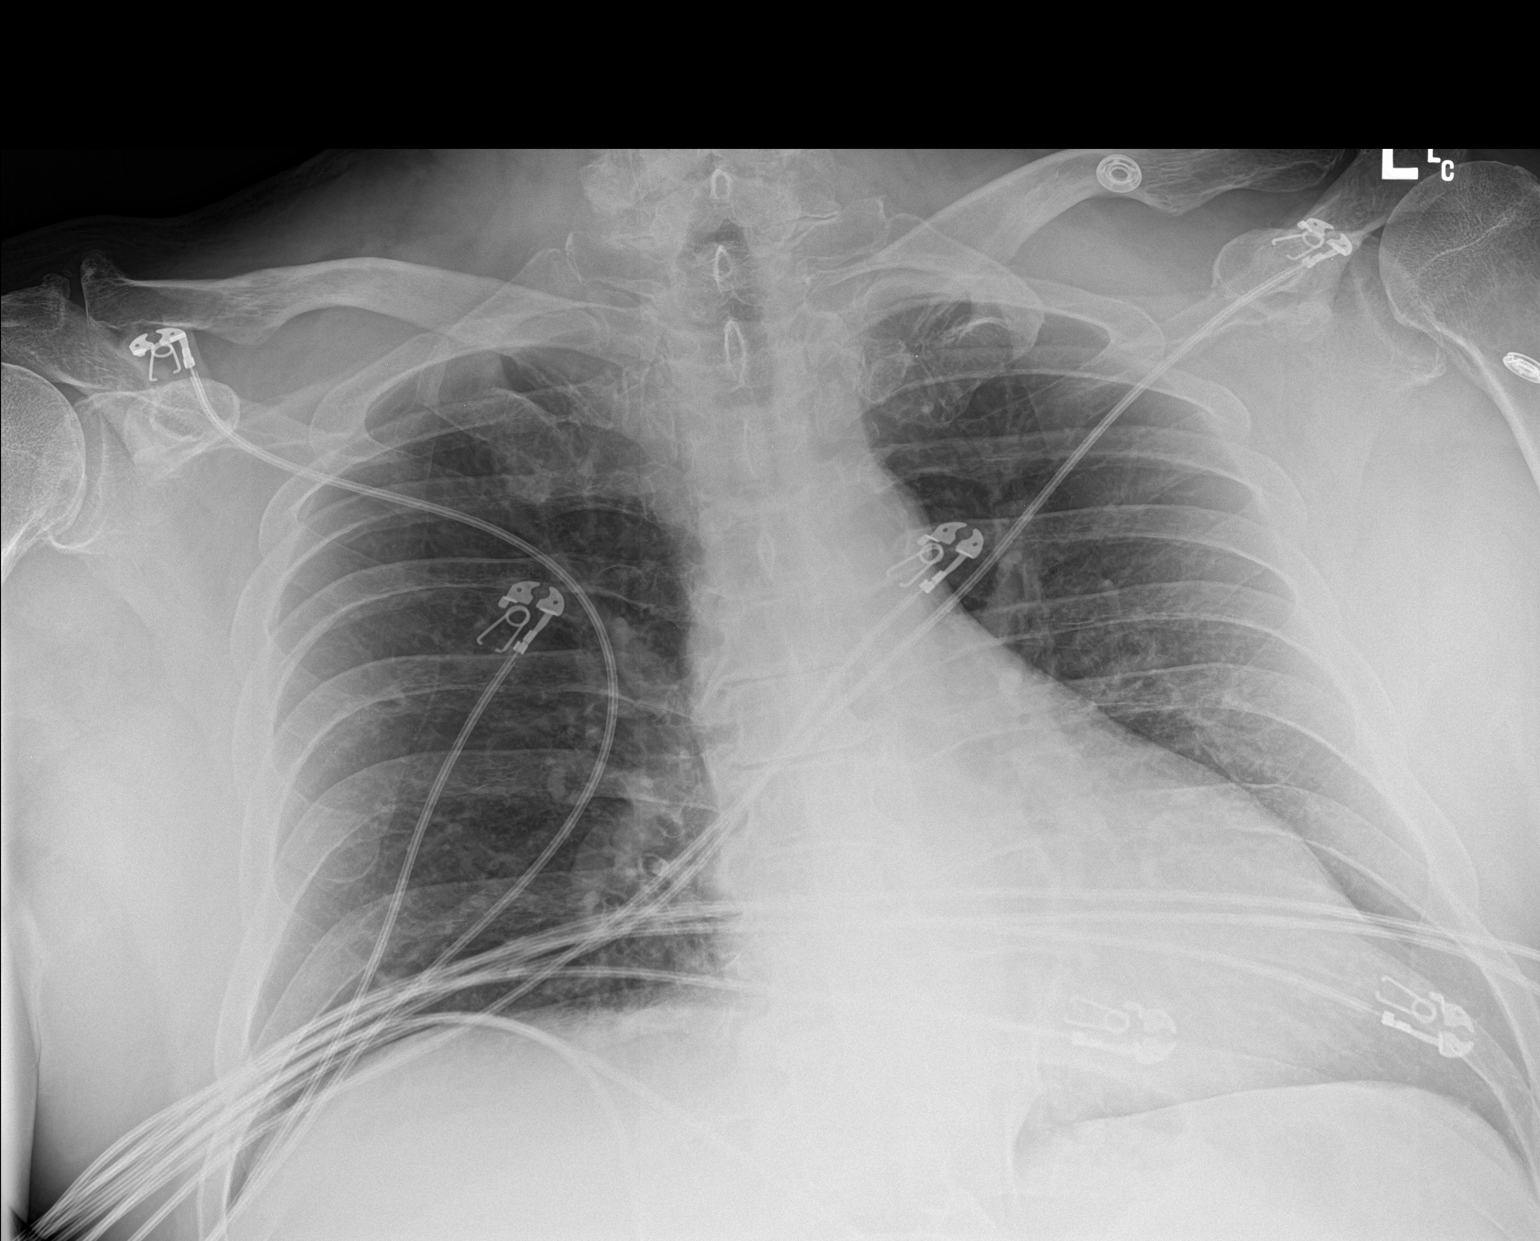

[1 of 1 positions shown; findings below may reference images not displayed]

FINDINGS: The heart size and mediastinal contours are within normal limits.
Both lungs are clear. The visualized skeletal structures are
unremarkable.
IMPRESSION: No active disease.

## 2021-03-18 IMAGING — US US ABDOMEN LIMITED RUQ/ASCITES
1 series · 14 of 25 positions shown · non-contrast
Comparison: None.

CLINICAL DATA: Transaminitis

EXAM:
ULTRASOUND ABDOMEN LIMITED RIGHT UPPER QUADRANT

[Series 1: us abdomen limited ruq (liver/gb) · 14 of 38 slices shown]
[im 1/38]
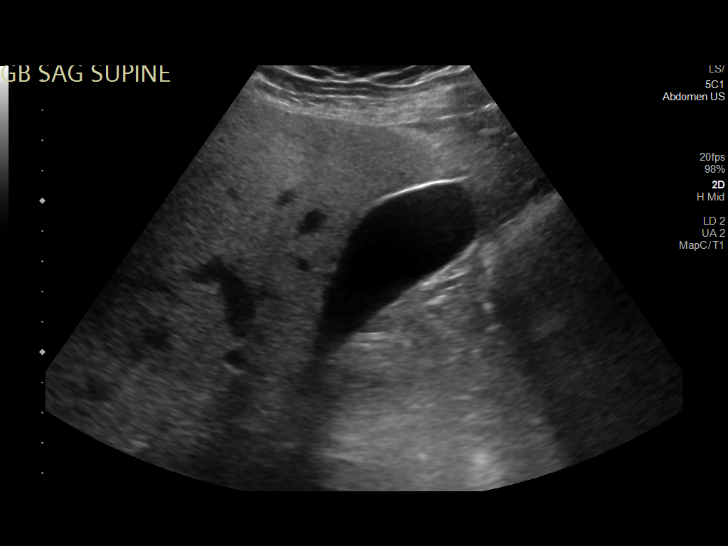
[im 4/38]
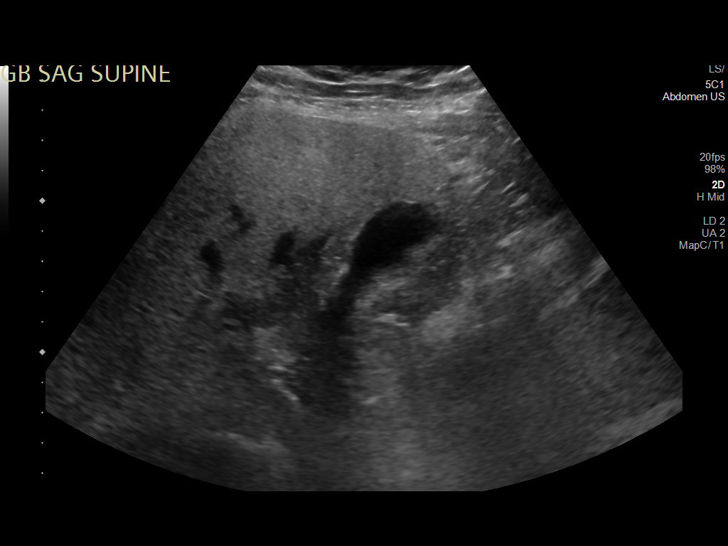
[im 7/38]
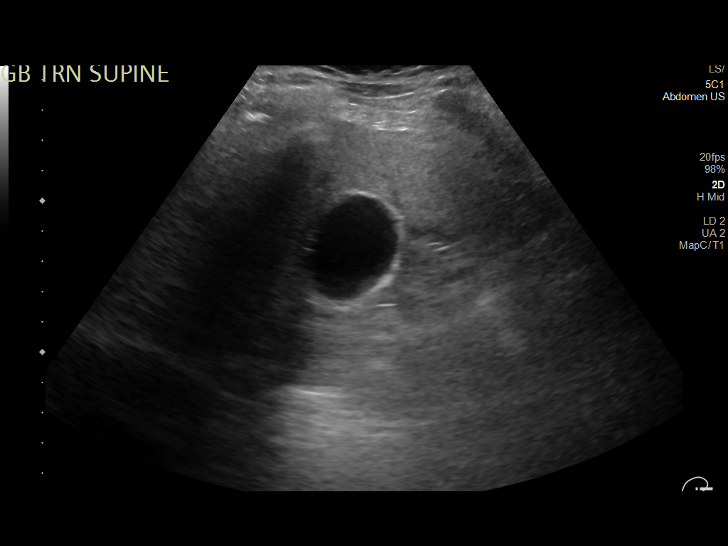
[im 10/38]
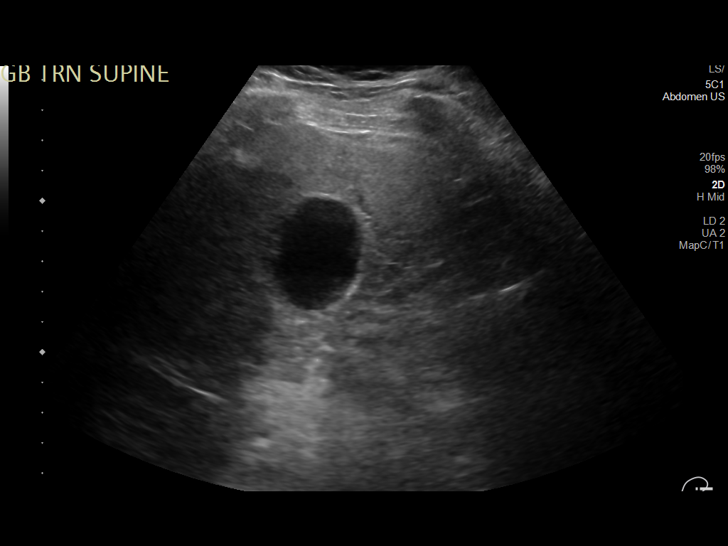
[im 13/38]
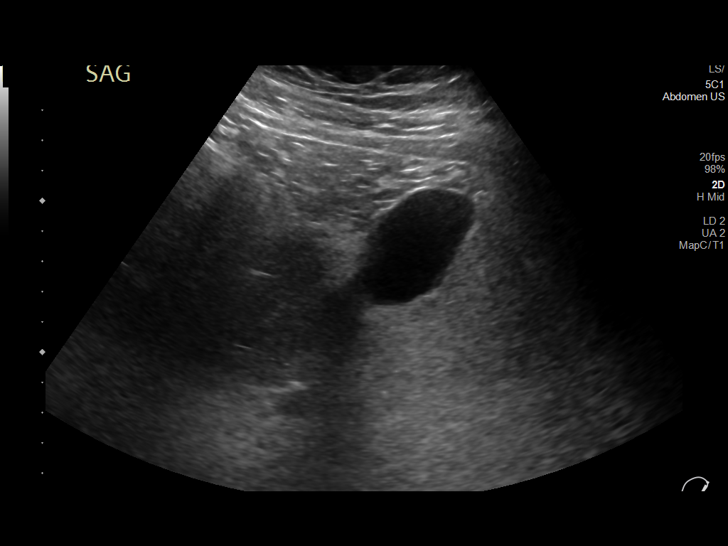
[im 14/38]
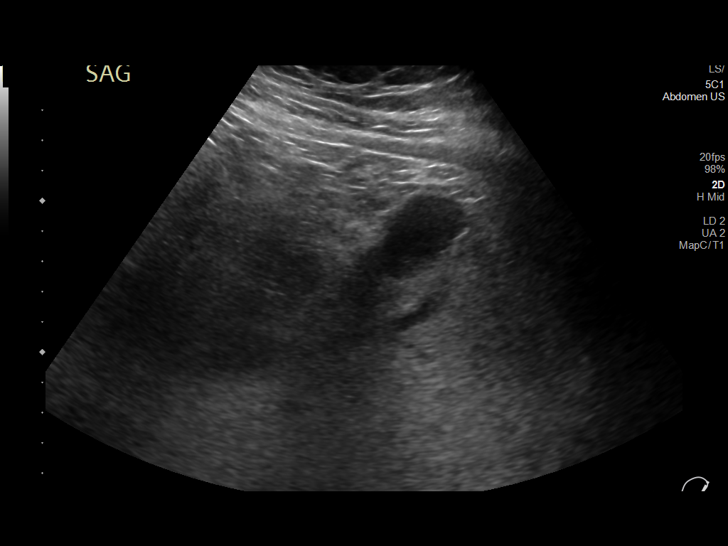
[im 17/38]
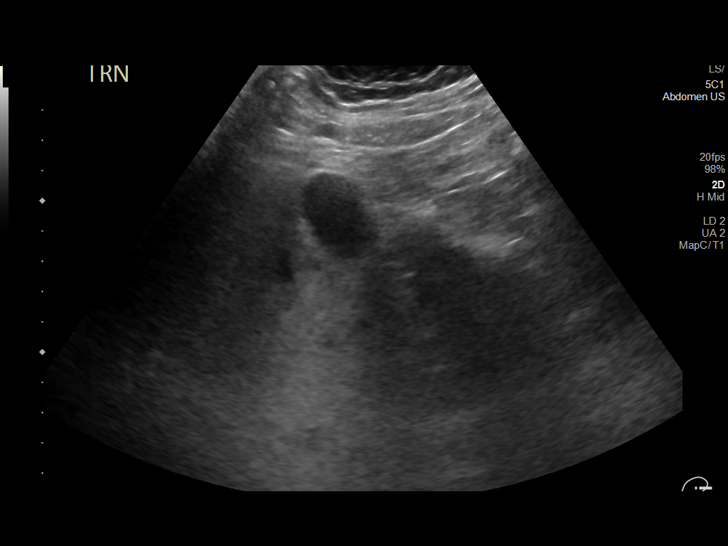
[im 21/38]
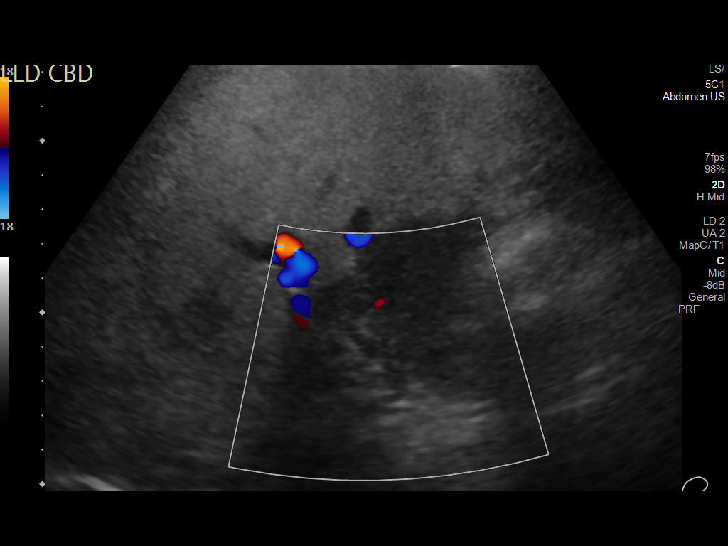
[im 24/38]
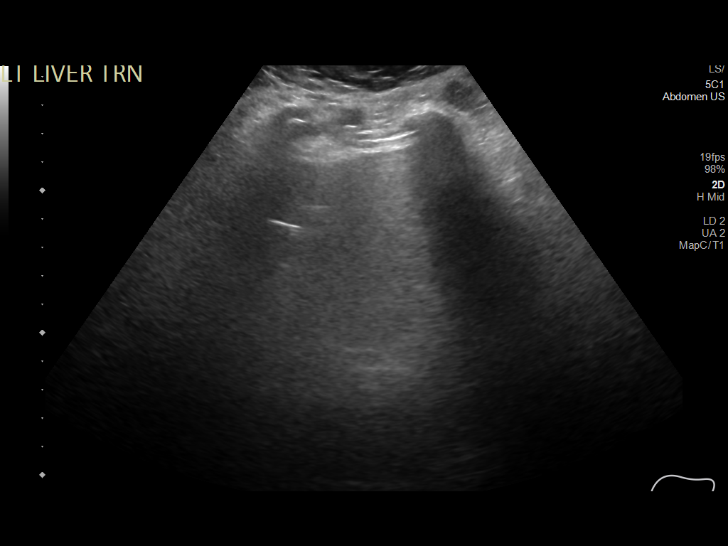
[im 25/38]
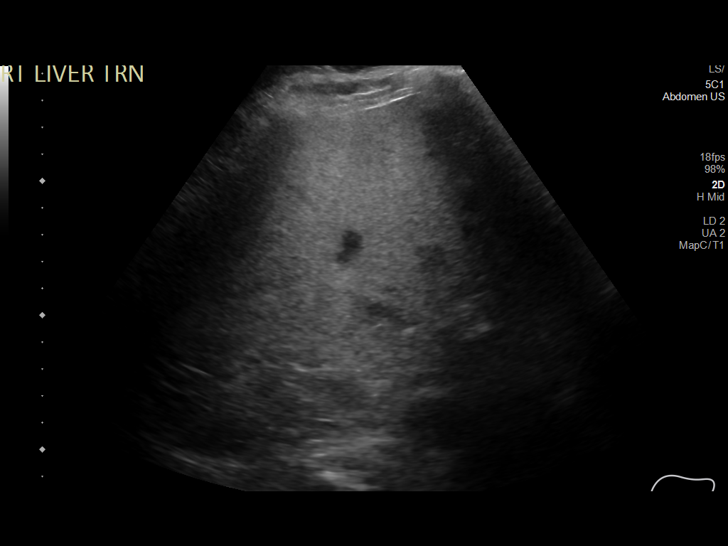
[im 28/38]
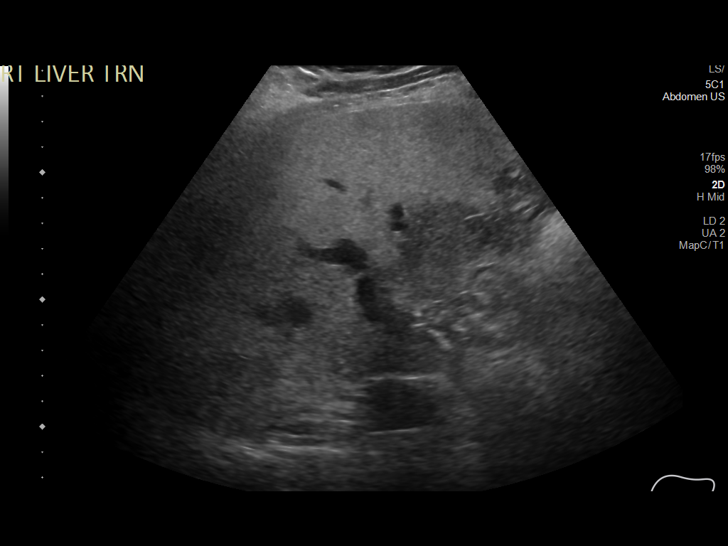
[im 31/38]
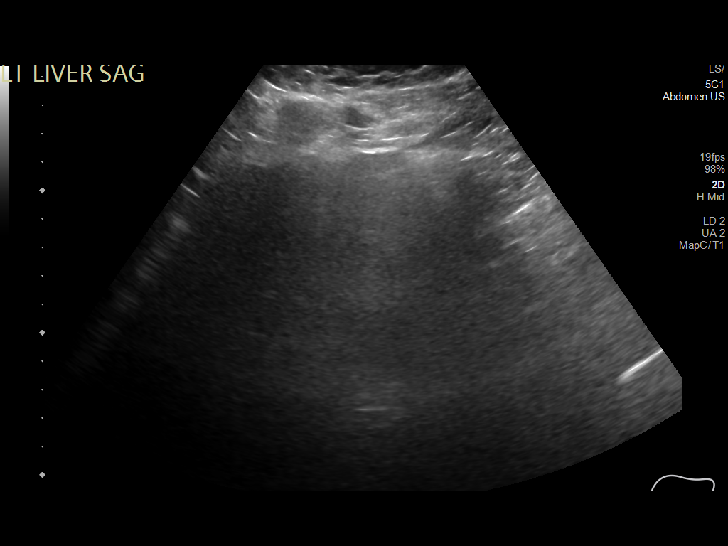
[im 34/38]
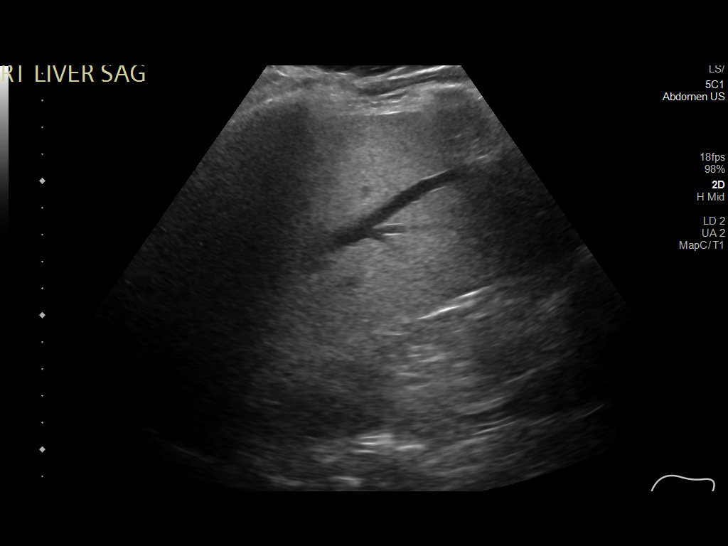
[im 38/38]
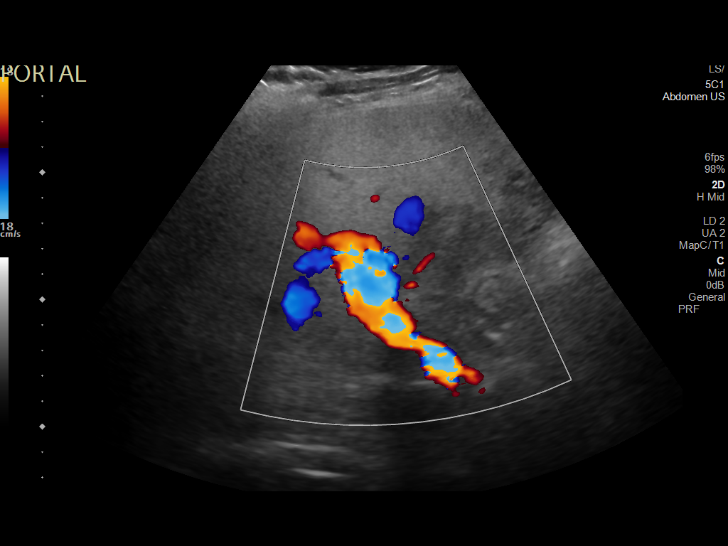

[14 of 25 positions shown; findings below may reference images not displayed]

FINDINGS: Gallbladder:

No gallstones or wall thickening visualized. No sonographic Murphy
sign noted by sonographer.

Common bile duct:

Diameter: Upper normal at 6.7 mm

Liver:

Diffusely echogenic. No focal hepatic abnormality. Portal vein is
patent on color Doppler imaging with normal direction of blood flow
towards the liver.

Other: None.
IMPRESSION: 1. Diffusely echogenic liver consistent with hepatic steatosis and
or hepatocellular disease.
2. Negative for gallstones.

## 2021-03-18 IMAGING — MR MR HEAD W/O CM
7 of 11 series · 25 of 48 positions shown · non-contrast
Comparison: None.

CLINICAL DATA: Delirium

EXAM:
MRI HEAD WITHOUT CONTRAST
TECHNIQUE: Multiplanar, multiecho pulse sequences of the brain and surrounding
structures were obtained without intravenous contrast.

[Series 2: DWI · axial · 3.0mm · 0.94mm/px · z∈[-46,+118]mm · 7 of 111 slices shown (1 of 2)]
[im 1/111]
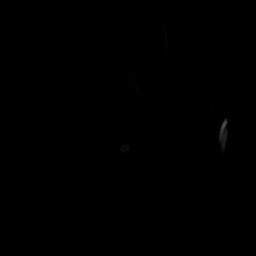
[im 19/111]
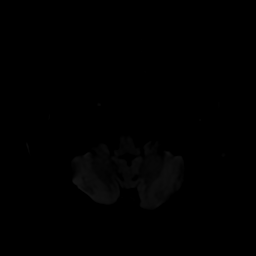
[im 37/111]
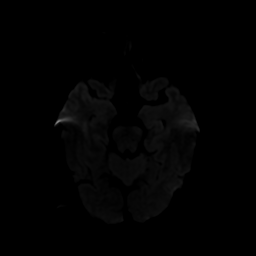
[im 56/111]
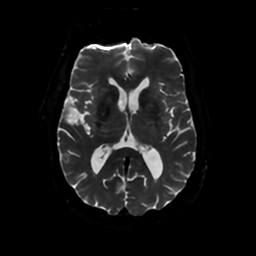
[im 74/111]
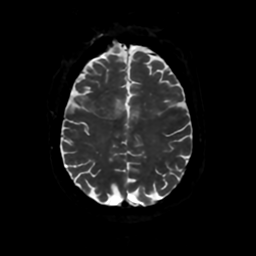
[im 92/111]
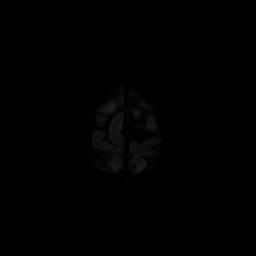
[im 111/111]
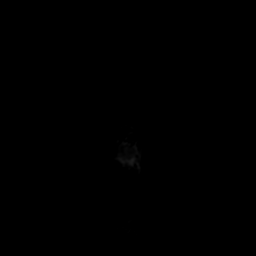

[Series 3: DWI · coronal · 4.0mm · 0.94mm/px · 6 of 81 slices shown (2 of 2)]
[im 1/81]
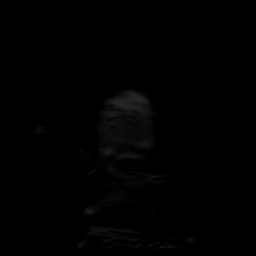
[im 17/81]
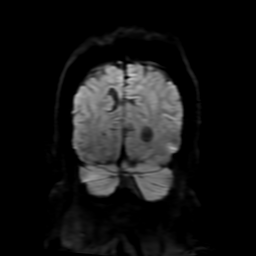
[im 33/81]
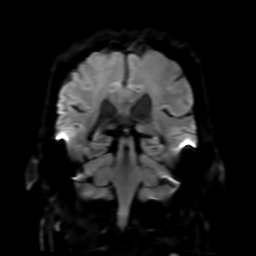
[im 49/81]
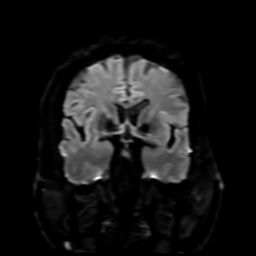
[im 65/81]
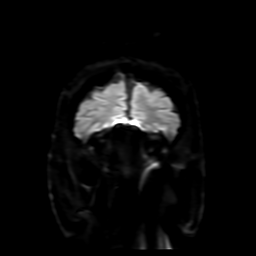
[im 81/81]
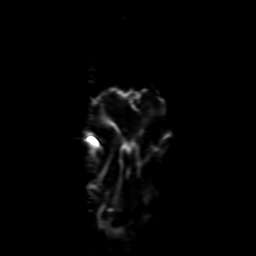

[Series 4: FLAIR · sagittal · 5.0mm · 0.23mm/px · 2 of 28 slices shown (1 of 2)]
[im 1/28]
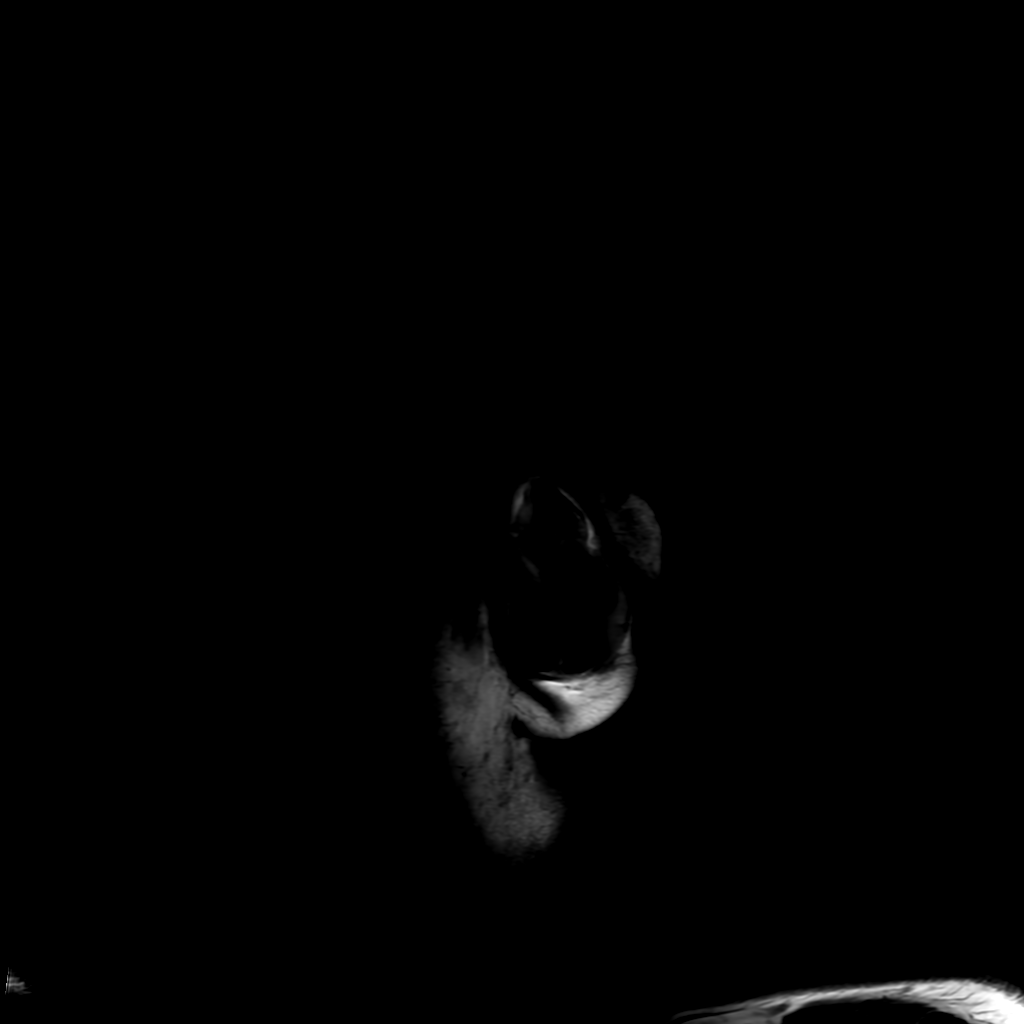
[im 28/28]
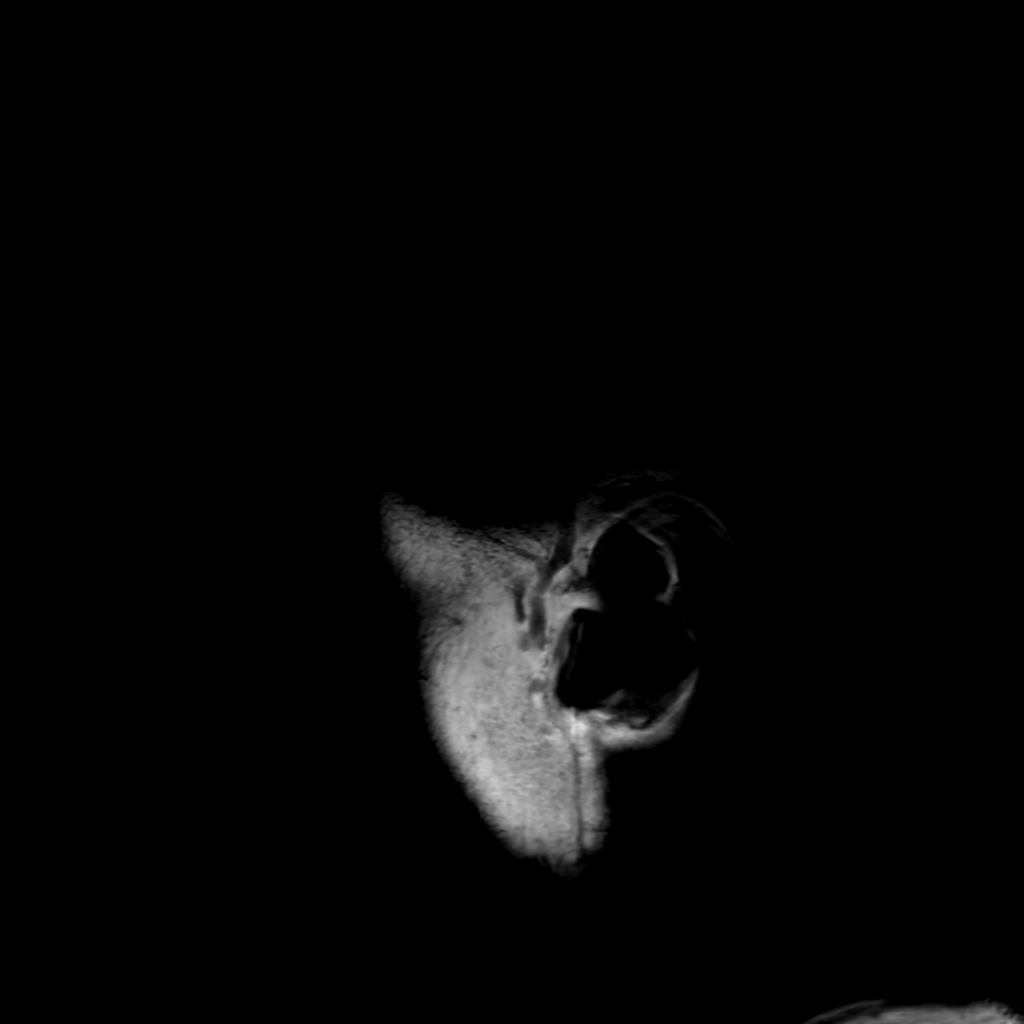

[Series 5: T2 · axial · 5.0mm · 0.23mm/px · 1 of 29 slices shown]
[im 1/29]
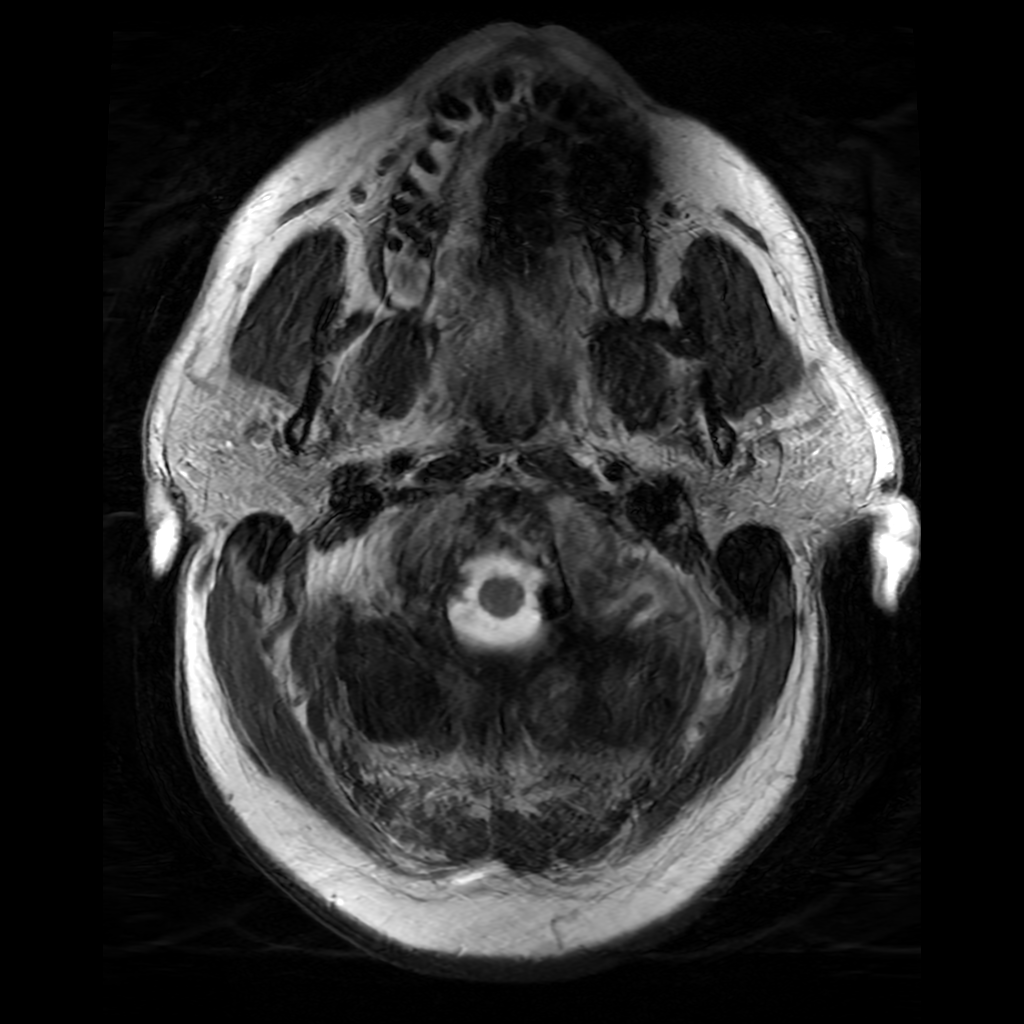

[Series 6: FLAIR · axial · 3.0mm · 0.45mm/px · z∈[-56,+112]mm · 2 of 29 slices shown (2 of 2)]
[im 1/29]
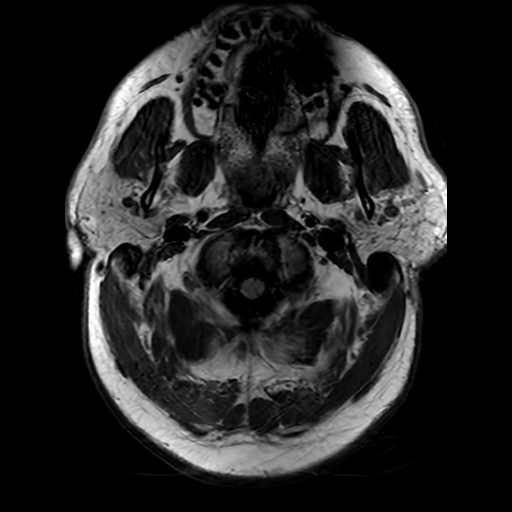
[im 29/29]
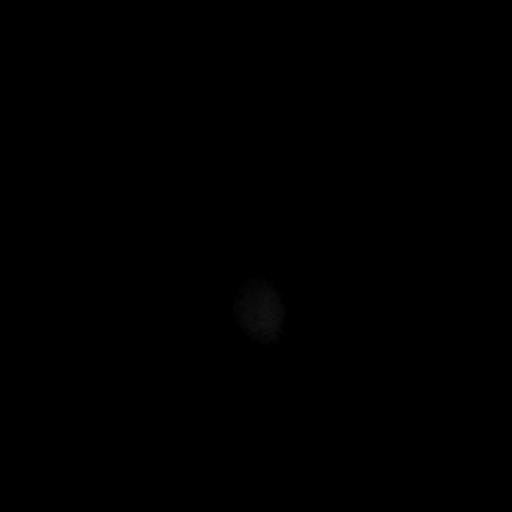

[Series 250: ADC · axial · 3.0mm · 0.94mm/px · z∈[-46,+118]mm · 4 of 56 slices shown (1 of 2)]
[im 1/56]
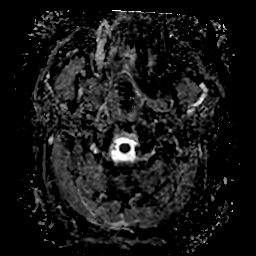
[im 19/56]
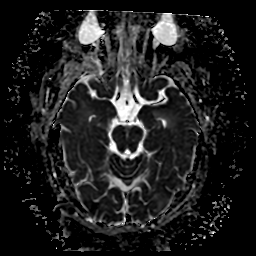
[im 37/56]
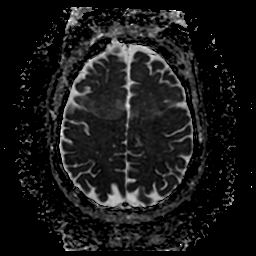
[im 56/56]
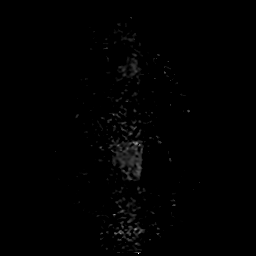

[Series 350: ADC · coronal · 4.0mm · 0.94mm/px · 3 of 40 slices shown (2 of 2)]
[im 1/40]
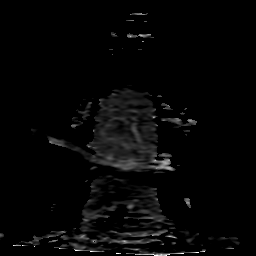
[im 20/40]
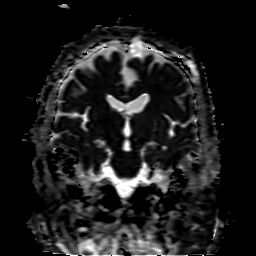
[im 40/40]
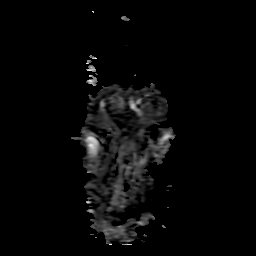

[25 of 48 positions shown; findings below may reference images not displayed]

FINDINGS: Brain: Punctate focus of diffusion hyperintensity in the left
parietal periventricular white matter. No evidence of intracranial
hemorrhage. There is no intracranial mass or mass effect. There is
no hydrocephalus or extra-axial fluid collection. Minimal patchy
foci of T2 hyperintensity in the supratentorial white matter are
nonspecific but may reflect minor chronic microvascular ischemic
changes. Prominence of the ventricles and sulci reflects minor
generalized parenchymal volume loss.

Vascular: Major vessel flow voids at the skull base are preserved.

Skull and upper cervical spine: Normal marrow signal is preserved.

Sinuses/Orbits: Paranasal sinus mucosal thickening, greatest within
the left maxillary sinus. Bilateral lens replacements.

Other: Sella is unremarkable.  Mastoid air cells are clear.
IMPRESSION: Punctate focus of acute to subacute infarction in the left parietal
periventricular white matter. No hemorrhage or mass.

## 2021-03-18 IMAGING — CT CT CERVICAL SPINE W/O CM
3 of 4 series · 10 of 33 positions shown, 12 images · non-contrast
Comparison: None.

CLINICAL DATA: Recent fall 2 days ago with headaches and neck pain,
initial encounter

EXAM:
CT HEAD WITHOUT CONTRAST
CT CERVICAL SPINE WITHOUT CONTRAST
TECHNIQUE: Multidetector CT imaging of the head and cervical spine was
performed following the standard protocol without intravenous
contrast. Multiplanar CT image reconstructions of the cervical spine
were also generated.

[Series 4: c_spine 2.0 st · axial · 0.38mm/px · z∈[+1119,+1189]mm · 2 of 106 slices shown, 3 images]
[im 36/106  soft-tissue]
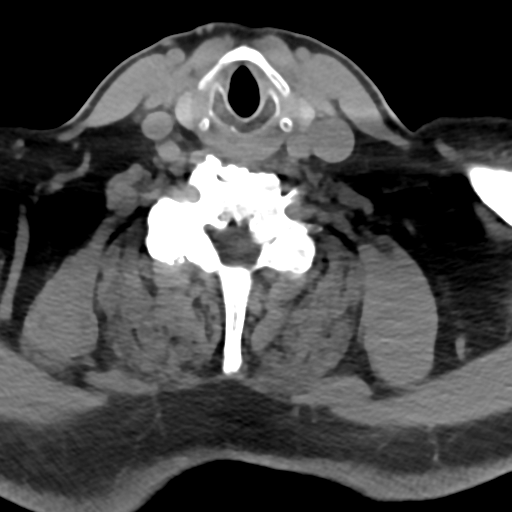
[im 36/106  bone]
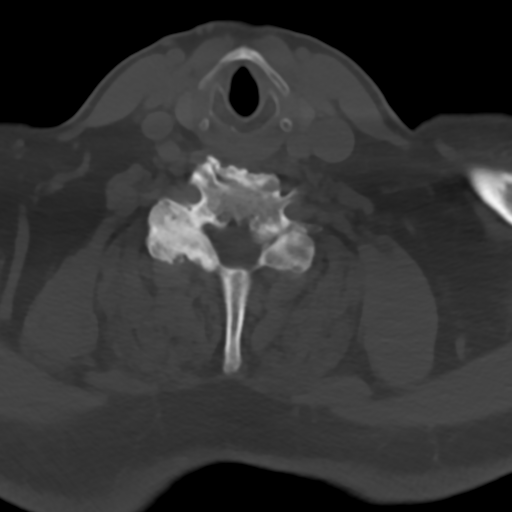
[im 71/106  bone]
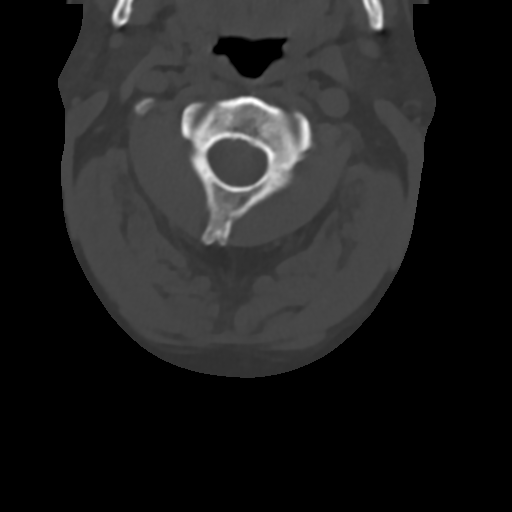

[Series 8: c_spine 2.0 sag bone · sagittal · 0.31mm/px · 5 of 61 slices shown, 6 images]
[im 21/61  bone]
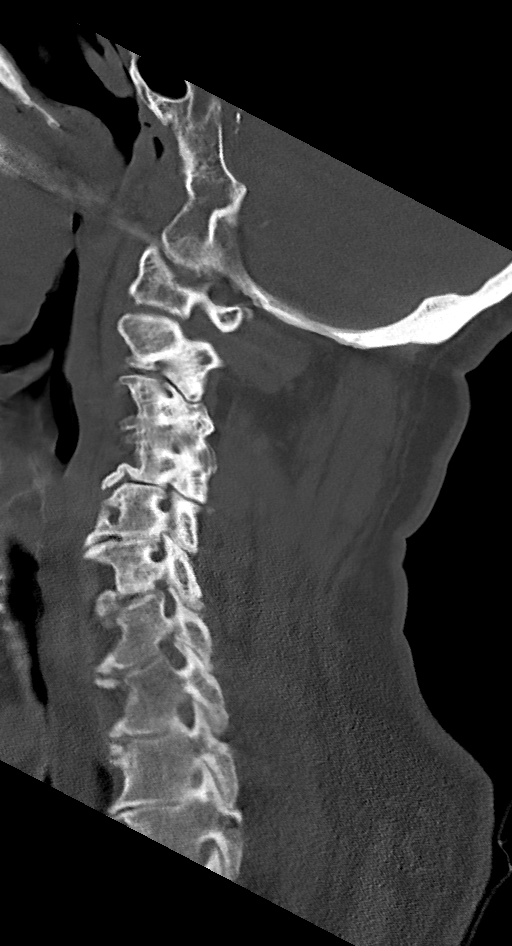
[im 26/61  bone]
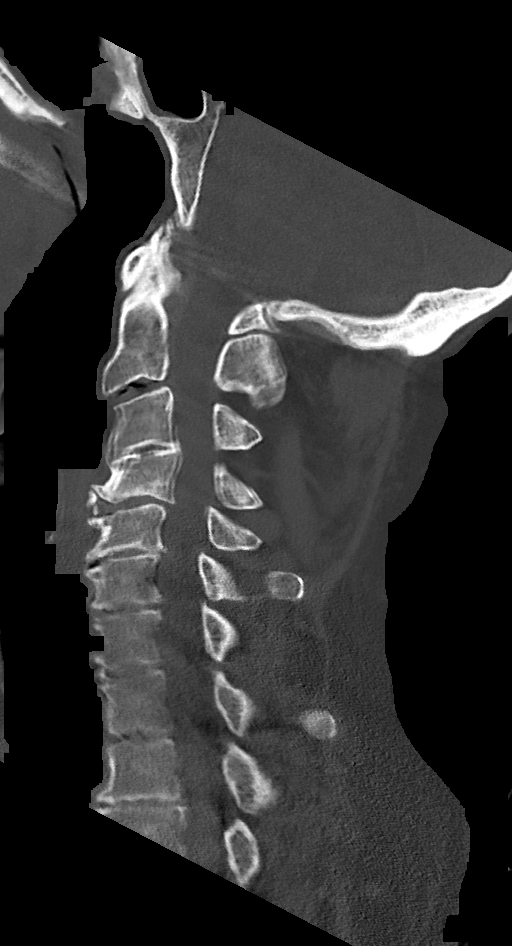
[im 31/61  soft-tissue]
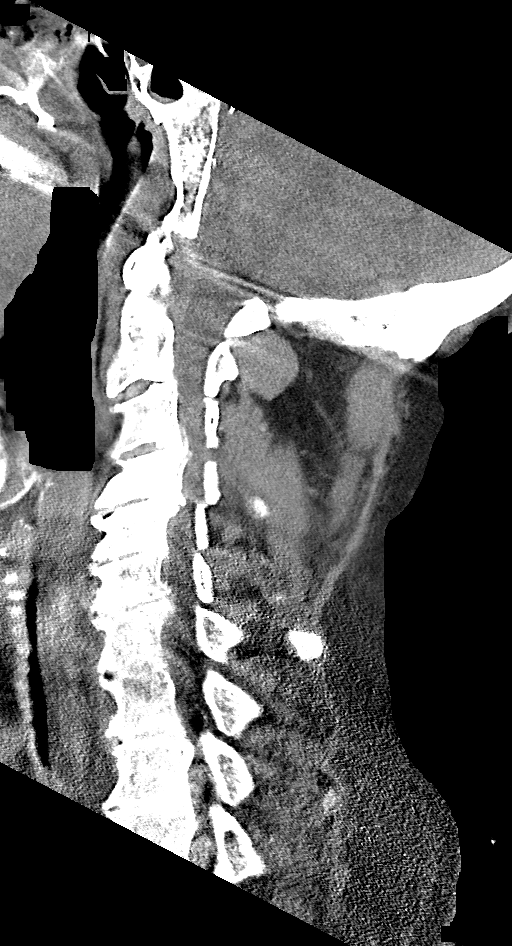
[im 31/61  bone]
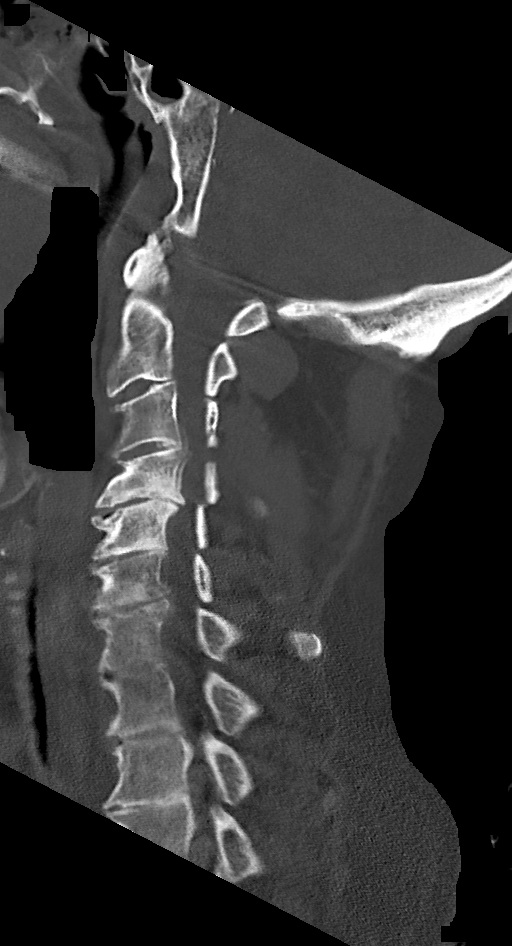
[im 36/61  bone]
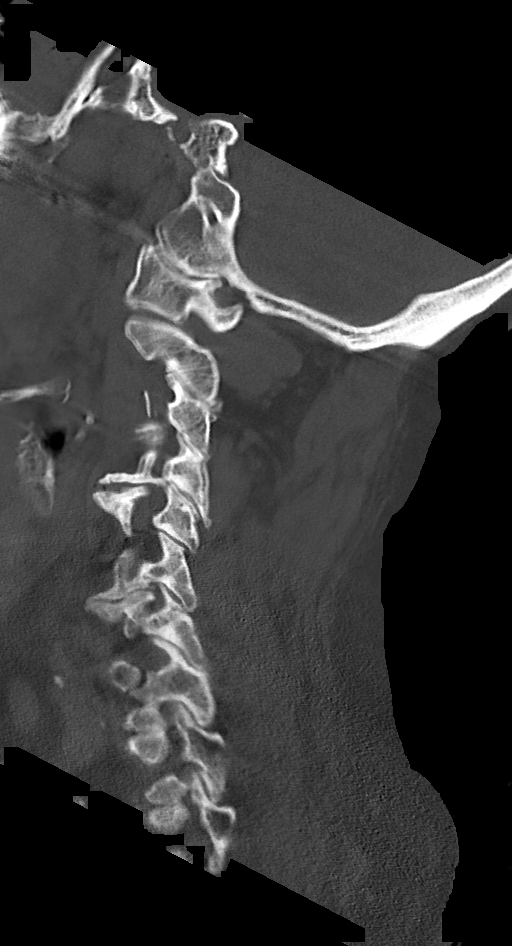
[im 41/61  bone]
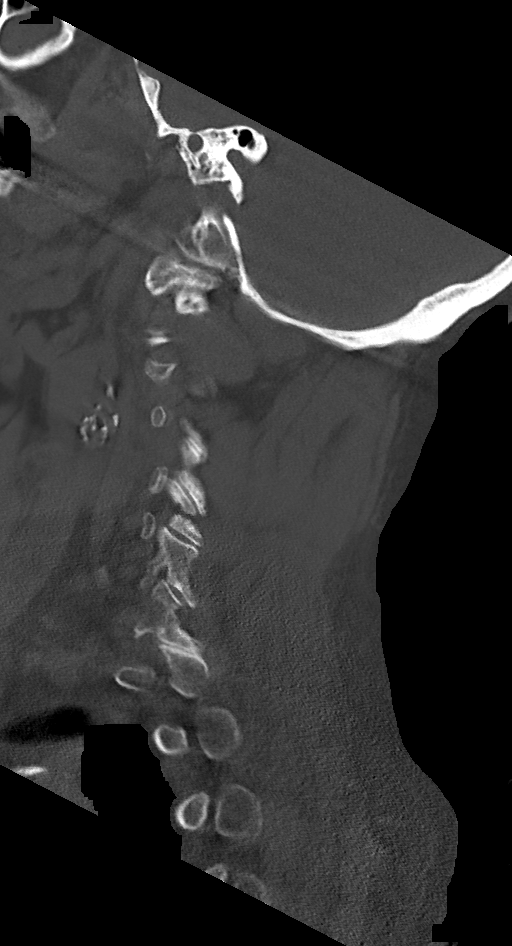

[Series 9: c_spine 2.0 cor bone · coronal · 0.31mm/px · 3 of 61 slices shown]
[im 14/61  bone]
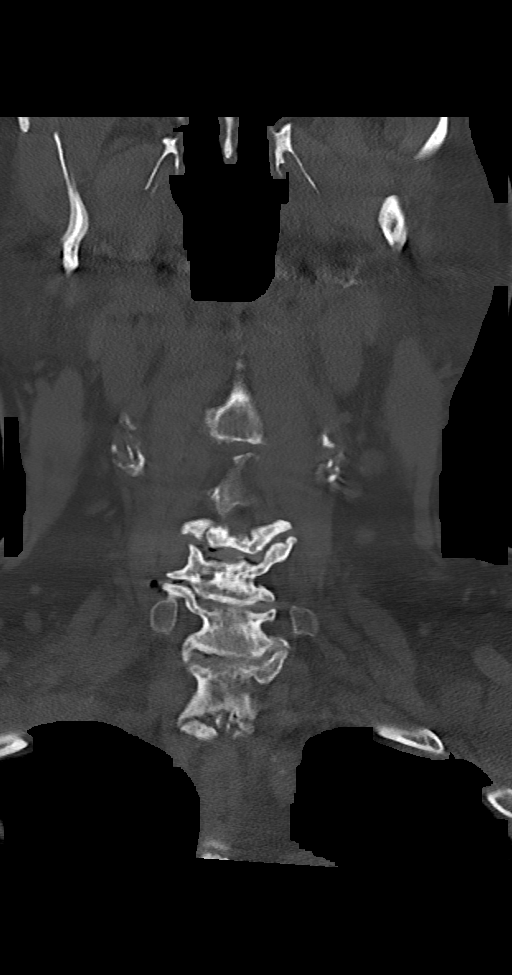
[im 25/61  bone]
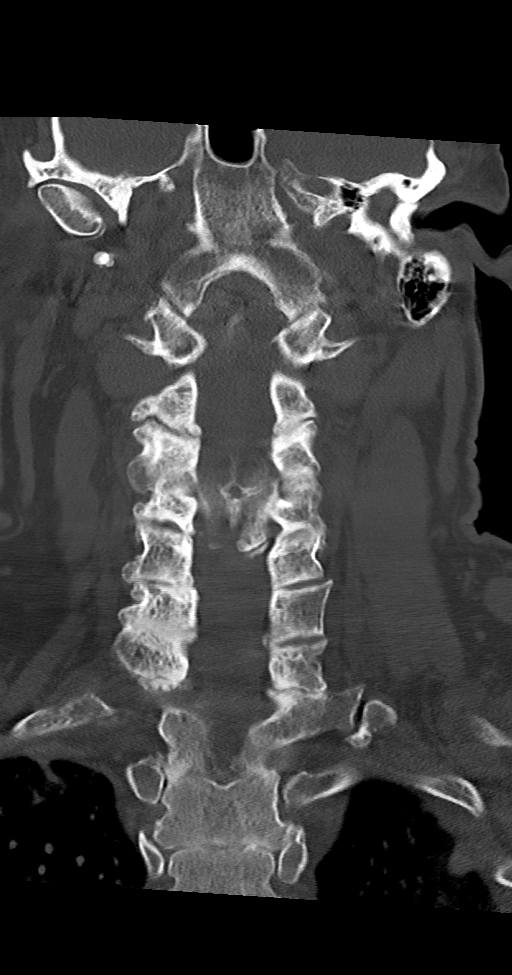
[im 36/61  bone]
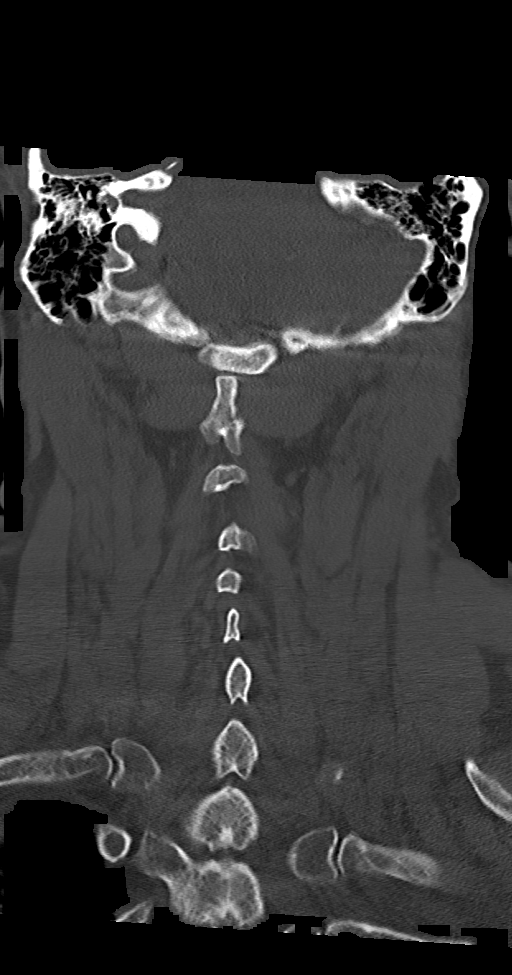

[10 of 33 positions shown; findings below may reference images not displayed]

FINDINGS: CT HEAD FINDINGS

Brain: No evidence of acute infarction, hemorrhage, hydrocephalus,
extra-axial collection or mass lesion/mass effect. Mild atrophic
changes are noted.

Vascular: No hyperdense vessel or unexpected calcification.

Skull: Normal. Negative for fracture or focal lesion.

Sinuses/Orbits: Mucosal thickening is noted within the left
maxillary antrum.

Other: Surrounding soft tissue structures are within normal limits.

CT CERVICAL SPINE FINDINGS

Alignment: Normal.

Skull base and vertebrae: Mild motion artifact is noted. Seven
cervical segments are well visualized. Vertebral body height is well
maintained. Disc space narrowing is noted from C3 to C7. Mild
osteophytic changes are seen at similar levels. Multilevel facet
hypertrophic changes are noted with associated neural foraminal
narrowing right greater than left. No acute fracture or acute facet
abnormality is seen.

Soft tissues and spinal canal: Surrounding soft tissue structures
are within normal limits.

Upper chest: Visualized lung apices are unremarkable.

Other: None
IMPRESSION: CT of the head: No acute intracranial abnormality is noted. Mucosal
thickening is noted within the left maxillary antrum.

CT of the cervical spine: Multilevel degenerative change without
acute abnormality.

## 2021-03-18 MED ORDER — SODIUM CHLORIDE 0.9% FLUSH
3.0000 mL | Freq: Two times a day (BID) | INTRAVENOUS | Status: DC
  Administered 2021-03-19 – 2021-03-20 (×3): 3 mL via INTRAVENOUS

## 2021-03-18 MED ORDER — SODIUM CHLORIDE 0.9 % IV BOLUS: 2000.0000 mL | Freq: Once | INTRAVENOUS | Status: DC

## 2021-03-18 MED ORDER — LACTATED RINGERS IV SOLN: INTRAVENOUS | Status: AC

## 2021-03-18 MED ORDER — HEPARIN SODIUM (PORCINE) 5000 UNIT/ML IJ SOLN
5000.0000 [IU] | Freq: Three times a day (TID) | INTRAMUSCULAR | Status: DC
  Administered 2021-03-18: 5000 [IU] via SUBCUTANEOUS
  Filled 2021-03-18: qty 1

## 2021-03-18 MED ORDER — ALPRAZOLAM 0.5 MG PO TABS
0.5000 mg | ORAL_TABLET | Freq: Every evening | ORAL | Status: DC | PRN
  Administered 2021-03-18 – 2021-03-19 (×2): 0.5 mg via ORAL
  Filled 2021-03-18 (×2): qty 1

## 2021-03-18 MED ORDER — VANCOMYCIN HCL 1500 MG/300ML IV SOLN
1500.0000 mg | Freq: Once | INTRAVENOUS | Status: AC
  Administered 2021-03-18: 1500 mg via INTRAVENOUS
  Filled 2021-03-18: qty 300

## 2021-03-18 MED ORDER — DOXYCYCLINE HYCLATE 100 MG PO TABS
100.0000 mg | ORAL_TABLET | Freq: Two times a day (BID) | ORAL | Status: DC
  Administered 2021-03-19 – 2021-03-20 (×3): 100 mg via ORAL
  Filled 2021-03-18 (×4): qty 1

## 2021-03-18 MED ORDER — SODIUM CHLORIDE 0.9 % IV SOLN
2.0000 g | Freq: Two times a day (BID) | INTRAVENOUS | Status: DC
  Administered 2021-03-19: 2 g via INTRAVENOUS
  Filled 2021-03-18: qty 2000
  Filled 2021-03-18: qty 2
  Filled 2021-03-18: qty 2000

## 2021-03-18 MED ORDER — DOXYCYCLINE HYCLATE 100 MG PO TABS: 100.0000 mg | ORAL_TABLET | Freq: Two times a day (BID) | ORAL | Status: DC

## 2021-03-18 MED ORDER — LACTATED RINGERS IV BOLUS
1000.0000 mL | Freq: Once | INTRAVENOUS | Status: AC
  Administered 2021-03-18: 1000 mL via INTRAVENOUS

## 2021-03-18 MED ORDER — DOXYCYCLINE HYCLATE 100 MG IV SOLR
200.0000 mg | Freq: Once | INTRAVENOUS | Status: AC
  Administered 2021-03-18: 200 mg via INTRAVENOUS
  Filled 2021-03-18: qty 200

## 2021-03-18 MED ORDER — SODIUM CHLORIDE 0.9 % IV SOLN
2.0000 g | Freq: Two times a day (BID) | INTRAVENOUS | Status: DC
  Administered 2021-03-19: 2 g via INTRAVENOUS
  Filled 2021-03-18: qty 20
  Filled 2021-03-18: qty 2

## 2021-03-18 NOTE — Hospital Course (Addendum)
- "  i'm trying to get everything situated here. I haven't eaten anything all day" - notes pain over the past few days: headaches,  "I don't seem to be going in the right direction"  Hx provided by wife at bedside: On Tuesday evening, she states that patient had bad headache with photophobia with altered mental status. "He was out of sorts."  He had recent medication started (amitryptiline) on Sunday and Monday. She called the pharmacist. Wednesday day time was home alone. Evening time, was extremely agitated and altered.  (Hyperactive delirium) He was seen by PA at his PCP's office who was concerned for stroke and was sent to the ED.  Has history of renal disease in the past and was ?treated and has been followed by [facility].  No similar episodes like this in the past.   Headaches since starting amitryptiline. Fall on Tuesday morning and hit his head on concrete floor.  No fevers at home but did note to be "shaky and clammy". No nausea/vomiting. No abdominal pain/constipation/diarrhea. No hematuria or hematochezia  No new rash.   PMHx: CKD, Hypertension  Medications:   Family History: Father - dementia, deceased at age 47 Mother - stroke (in 39's and late 14s), deceased from breast cancer  Uncle - renal failure deceased from this, unknown age  Social History: Lives at home with wife. 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 Notes prior history of tobacco use, none over the past 20 years Endorses alcohol use - "not very often" No extramarital affairs, no history of STI's, no illicit drug use.    *This AM: States he's doing okay this morning, still having headache. Mentions HA is similar to one prior to arrival.  Wife states she doesn't think he's back to normal, but improved from yesterday. Tuesday/Wednesday didn't eat, hasn't eaten much since he arrived here.   Discussed MRI results, discussed possible etiology of tick borne illness vs viral.

## 2021-03-18 NOTE — ED Notes (Signed)
Pt to [facility].  

## 2021-03-18 NOTE — ED Notes (Signed)
Attempted report x 2 

## 2021-03-18 NOTE — ED Provider Notes (Signed)
[facility] EMERGENCY DEPARTMENT Provider Note   CSN: [id number] Arrival date & time: 03/18/21  1252     History Chief Complaint  Patient presents with  . Altered Mental Status    Noah Pacheco is a 70 y.o. male with a history of CKD, hepatic steatosis, osteoarthritis of the hips, anemia, anxiety, hypertension, hyperlipidemia, and prediabetes who presents to the emergency department with his wife from primary care office for evaluation of altered mental status over the past 2.5 days.  Patient's wife provides primary history- she relays that the patient has been continuously altered since Monday evening, he seems confused, he makes random statements that do not make sense and starts to perform activities that are illogical.  For example he stated that he needed to take Remigio Eisenmenger to school, Remigio Eisenmenger is their dog, they do not have any children, the dog is not enrolled in any type of class.  There do not seem to be any alleviating or aggravating factors to his confusion.  He has been complaining of a headache as well as neck pain, did have a fall on Tuesday (2 days ago) with head injury and no loss of consciousness, however he was already confused prior to this.  Of note he recently started amitriptyline on Sunday 05/01-took this Sunday and Monday, stopped on Tuesday due to the confusion he developed.  No other new medications.  They have pulled 4-5 ticks off of his body mostly in the beltline region past 1 week.  He has been in [place] for the past several weeks, no recent foreign travel.  They have not noted any fevers, vomiting, diarrhea, chest pain, dyspnea, abdominal pain, cough, leg swelling, or new rashes.  They were seen by PCP today who referred them to the emergency department for further assessment.  Patient tells me that his head does hurt and so does his neck, he is unsure about gradual versus quick onset, he has trouble coming up with the date and month.  He denies symptoms  above as is his wife.  He states that he drinks occasionally, not daily.  HPI     History reviewed. No pertinent past medical history.  There are no problems to display for this patient.   History reviewed. No pertinent surgical history.     History reviewed. No pertinent family history.     Home Medications Prior to Admission medications   Not on File    Allergies    Patient has no allergy information on record.  Review of Systems   Review of Systems  Constitutional: Negative for fever.  Respiratory: Negative for shortness of breath.   Cardiovascular: Negative for chest pain.  Gastrointestinal: Negative for abdominal pain, diarrhea and vomiting.  Genitourinary: Negative for dysuria.  Musculoskeletal: Positive for neck pain. Negative for back pain.  Skin: Negative for rash.  Neurological: Positive for headaches.  Psychiatric/Behavioral: Positive for confusion.  All other systems reviewed and are negative.   Physical Exam Updated Vital Signs BP (!) 132/57 (BP Location: Right Arm)   Pulse 78   Temp 98.2 F (36.8 C)   Resp 18   SpO2 93%   Physical Exam Vitals and nursing note reviewed.  Constitutional:      Appearance: He is well-developed. He is not toxic-appearing.  HENT:     Head: Normocephalic and atraumatic.     Ears:     Comments: No hemotympanums.    Mouth/Throat:     Mouth: Mucous membranes are dry.  Eyes:  General:        Right eye: No discharge.        Left eye: No discharge.     Extraocular Movements: Extraocular movements intact.     Conjunctiva/sclera: Conjunctivae normal.     Pupils: Pupils are equal, round, and reactive to light.  Cardiovascular:     Rate and Rhythm: Normal rate and regular rhythm.  Pulmonary:     Effort: Pulmonary effort is normal. No respiratory distress.     Breath sounds: Normal breath sounds. No wheezing, rhonchi or rales.  Abdominal:     General: There is no distension.     Palpations: Abdomen is soft.      Tenderness: There is no abdominal tenderness. There is no guarding or rebound.  Musculoskeletal:     Cervical back: Neck supple. Tenderness (Diffuse midline and bilateral paraspinal muscles.  No point/focal vertebral tenderness or palpable step-off.) present. No rigidity.     Right lower leg: No edema.     Left lower leg: No edema.  Skin:    General: Skin is warm and dry.     Findings: No rash.  Neurological:     Mental Status: He is alert.     Comments: Clear speech.  CN III through XII grossly intact.  Sensation gross intact bilateral upper and lower extremities.  5 out of 5 symmetric grip strength.  5 out of 5 strength with plantar dorsiflexion bilaterally.  Patient is able to perform finger-to-nose bilaterally.  He is oriented to person, place, unsure of time-cannot tell me the month or the day, cannot tell me that it is 2022.  He does seem somewhat confused during interview.  Psychiatric:        Mood and Affect: Affect is inappropriate.     ED Results / Procedures / Treatments   Labs (all labs ordered are listed, but only abnormal results are displayed) Labs Reviewed  COMPREHENSIVE METABOLIC PANEL - Abnormal; Notable for the following components:      Result Value   CO2 21 (*)    BUN 86 (*)    Creatinine, Ser 6.52 (*)    AST 261 (*)    ALT 108 (*)    Total Bilirubin 1.3 (*)    GFR, Estimated 9 (*)    Anion gap 16 (*)    All other components within normal limits  CBC WITH DIFFERENTIAL/PLATELET - Abnormal; Notable for the following components:   RBC 3.84 (*)    Hemoglobin 12.7 (*)    MCV 102.3 (*)    All other components within normal limits  RESP PANEL BY RT-PCR (FLU A&B, COVID) ARPGX2  ETHANOL  AMMONIA  ROCKY MTN SPOTTED FVR ABS PNL(IGG+IGM)  B. BURGDORFI ANTIBODIES  URINALYSIS, ROUTINE W REFLEX MICROSCOPIC  RAPID URINE DRUG SCREEN, HOSP PERFORMED  LACTIC ACID, PLASMA  LACTIC ACID, PLASMA  CK    EKG None  Radiology CT Head Wo Contrast  Result Date:  03/18/2021 CLINICAL DATA:  Recent fall 2 days ago with headaches and neck pain, initial encounter EXAM: CT HEAD WITHOUT CONTRAST CT CERVICAL SPINE WITHOUT CONTRAST TECHNIQUE: Multidetector CT imaging of the head and cervical spine was performed following the standard protocol without intravenous contrast. Multiplanar CT image reconstructions of the cervical spine were also generated. COMPARISON:  None. FINDINGS: CT HEAD FINDINGS Brain: No evidence of acute infarction, hemorrhage, hydrocephalus, extra-axial collection or mass lesion/mass effect. Mild atrophic changes are noted. Vascular: No hyperdense vessel or unexpected calcification. Skull: Normal. Negative for fracture or focal  lesion. Sinuses/Orbits: Mucosal thickening is noted within the left maxillary antrum. Other: Surrounding soft tissue structures are within normal limits. CT CERVICAL SPINE FINDINGS Alignment: Normal. Skull base and vertebrae: Mild motion artifact is noted. Seven cervical segments are well visualized. Vertebral body height is well maintained. Disc space narrowing is noted from C3 to C7. Mild osteophytic changes are seen at similar levels. Multilevel facet hypertrophic changes are noted with associated neural foraminal narrowing right greater than left. No acute fracture or acute facet abnormality is seen. Soft tissues and spinal canal: Surrounding soft tissue structures are within normal limits. Upper chest: Visualized lung apices are unremarkable. Other: None IMPRESSION: CT of the head: No acute intracranial abnormality is noted. Mucosal thickening is noted within the left maxillary antrum. CT of the cervical spine: Multilevel degenerative change without acute abnormality. Electronically Signed   By: Alcide CleverMark  Lukens M.D.   On: 03/18/2021 14:26   CT Cervical Spine Wo Contrast  Result Date: 03/18/2021 CLINICAL DATA:  Recent fall 2 days ago with headaches and neck pain, initial encounter EXAM: CT HEAD WITHOUT CONTRAST CT CERVICAL SPINE WITHOUT  CONTRAST TECHNIQUE: Multidetector CT imaging of the head and cervical spine was performed following the standard protocol without intravenous contrast. Multiplanar CT image reconstructions of the cervical spine were also generated. COMPARISON:  None. FINDINGS: CT HEAD FINDINGS Brain: No evidence of acute infarction, hemorrhage, hydrocephalus, extra-axial collection or mass lesion/mass effect. Mild atrophic changes are noted. Vascular: No hyperdense vessel or unexpected calcification. Skull: Normal. Negative for fracture or focal lesion. Sinuses/Orbits: Mucosal thickening is noted within the left maxillary antrum. Other: Surrounding soft tissue structures are within normal limits. CT CERVICAL SPINE FINDINGS Alignment: Normal. Skull base and vertebrae: Mild motion artifact is noted. Seven cervical segments are well visualized. Vertebral body height is well maintained. Disc space narrowing is noted from C3 to C7. Mild osteophytic changes are seen at similar levels. Multilevel facet hypertrophic changes are noted with associated neural foraminal narrowing right greater than left. No acute fracture or acute facet abnormality is seen. Soft tissues and spinal canal: Surrounding soft tissue structures are within normal limits. Upper chest: Visualized lung apices are unremarkable. Other: None IMPRESSION: CT of the head: No acute intracranial abnormality is noted. Mucosal thickening is noted within the left maxillary antrum. CT of the cervical spine: Multilevel degenerative change without acute abnormality. Electronically Signed   By: Alcide CleverMark  Lukens M.D.   On: 03/18/2021 14:26    Procedures .Critical Care Performed by: Cherly AndersonPetrucelli, Hatsumi Steinhart R, PA-C Authorized by: Cherly AndersonPetrucelli, Devone Tousley R, PA-C    CRITICAL CARE Performed by: Harvie HeckSamantha Dream Nodal   Total critical care time: 30 minutes  Critical care time was exclusive of separately billable procedures and treating other patients.  Critical care was necessary to treat  or prevent imminent or life-threatening deterioration.  Critical care was time spent personally by me on the following activities: development of treatment plan with patient and/or surrogate as well as nursing, discussions with consultants, evaluation of patient's response to treatment, examination of patient, obtaining history from patient or surrogate, ordering and performing treatments and interventions, ordering and review of laboratory studies, ordering and review of radiographic studies, pulse oximetry and re-evaluation of patient's condition.    Medications Ordered in ED Medications  sodium chloride 0.9 % bolus 2,000 mL (has no administration in time range)    ED Course  I have reviewed the triage vital signs and the nursing notes.  Pertinent labs & imaging results that were available during my care of  the patient were reviewed by me and considered in my medical decision making (see chart for details).    MDM Rules/Calculators/A&P                         Patient presents to the ED for evaluation of altered mental status over the past 2.5 days as well as complaints of head and neck pain.  Nontoxic, vitals without significant abnormality.  He has no focal neurologic deficits on exam, he does seem a bit confused, disoriented to day and month, also somewhat inappropriate affect at times.  He is afebrile with no nuchal rigidity.  Plan for head CT and cervical spine CT as well as labs and chest x-ray.  Additional history obtained:  Additional history obtained from chart review & nursing note review.   EKG: no STEMI Lab Tests:  I Ordered, reviewed, and interpreted labs, which included:  CBC: Mild anemia CMP: Patient in acute renal failure with a creatinine of 6.52 and a BUN of 86, recent on record reveal a creatinine of 1.19 and a BUN of 20.  Potassium within normal limits.  Patient also with transaminitis, has not had this with most recent labs either.  Imaging Studies ordered:  I  ordered imaging studies which included CT head/cspine & CXR, I independently reviewed, formal radiology impression shows:   CT head/Cspine: CT of the head: No acute intracranial abnormality is noted. Mucosal thickening is noted within the left maxillary antrum. CT of the cervical spine: Multilevel degenerative change without acute abnormality  CXR: No active disease.   ED Course:  No focal neuro deficits on exam, but is quite confused with inappropriate affect. No meningismus. CT head w/o bleed or obvious mass. Patient in acute renal failure with transaminitis as well as mildly low bicarb with anion gap elevation.  Patient has made urine since arrival to the emergency department, will obtain postvoid residual bladder scan to determine if he is retaining, 2 L of normal saline ordered for hydration, will add on CK and lactic acid levels, obtain renal US, &  will discuss with nephrology as well as critical care. Patient & his wife updated on results & plan of care.   15:15: CONSULT: Discussed with critical care provider Pete- okay to admit to medicine.   15:30: CONSULT: Discussed with nephrologist, Dr. Hyman Hopes, in agreement with plan for bladder scan to check for obstruction and with hydrating of the patient. Recommends holding any ACEI/ARBs (patient is on enapril) or diuretics if patient takes these. Medicine can re consult if any concerns/not improving. Appreciate consultation.   Patient urinating at this time, nursing staff aware of need for post void bladder scan.   15:51: CONSULT: Discussed with internal medicine residency service- accept admission.   Findings and plan of care discussed with supervising physician Dr. Rosalia Hammers who has evaluated patient & is in agreement.   Portions of this note were generated with Scientist, clinical (histocompatibility and immunogenetics). Dictation errors may occur despite best attempts at proofreading.  Final Clinical Impression(s) / ED Diagnoses Final diagnoses:  Altered mental status,  unspecified altered mental status type  Acute renal failure, unspecified acute renal failure type [facility])  Elevated LFTs    Rx / DC Orders ED Discharge Orders    None       Cherly Anderson, PA-C 03/18/21 1553    Margarita Grizzle, MD 03/20/21 1724

## 2021-03-18 NOTE — ED Triage Notes (Signed)
BIB [facility] from PT primary office after provider reported increase AMS. Per EMS pt fell over dogs on Tuesday striking head. Pt not on thinners. Wife endorses that pt has increase confusion/agitation, head and neck pain. Pt has hx of Gout, CKD,. Pt A & O to self and place.

## 2021-03-18 NOTE — ED Notes (Signed)
Attempted report x1, pt not yet accepted

## 2021-03-18 NOTE — H&P (Deleted)
Date: 03/18/2021               Patient Name:  Noah Pacheco MRN: [id number]  DOB: 09-08-1951 Age / Sex: 70 y.o., male   PCP: Gwenlyn Found, MD              Medical Service: Internal Medicine Teaching Service              Attending Physician: Dr. [name], MD    First Contact: Laverna Peace, MS4 Pager: [phone]  Second Contact: Dr. Eliezer Bottom  Pager: [phone]  Third Contact Dr. Evlyn Kanner Pager: [phone]       After Hours (After 5p/  First Contact Pager: [phone]  weekends / holidays): Second Contact Pager: [phone]   Chief Complaint: altered mental status   History of Present Illness: Noah Pacheco is a 70 year old male with a history of CKD and fatty liver disease who presented to the ED from his PCP office due to 2.5 days of altered mental status. The majority of the history was taken from the wife at bedside as patient was still significantly altered.  Wife reports that the patient began taking amitriptyline on Sunday and Monday, he began complaining of headaches shortly after starting this medication. She says that since Monday evening he has been acting not himself. She called the pharmacist and medication was discontinued. However the patient has had progressively worsening AMS since Monday.  Acting illogically and making random comments that do not make sense.  On Tuesday evening, she states that patient had bad headache with photophobia with altered mental status. Of note he did fall Tuesday morning and hit his head on concrete floor, however headaches preceded fall.   Then Wednesday patient was home alone during the day while wife was at work. When she returned home that evening time he was extremely agitated and altered. (Hyperactive delirium) The house was a mess as if he had gone through drawers and just moved things around in ways that were not logical.   Has history of renal disease in the past and was ?treated and has been followed by [facility].  No similar  episodes like this in the past.  No fevers at home but did note to be "shaky and clammy". No nausea/vomiting. No abdominal pain/constipation/diarrhea. No hematuria or hematochezia  Patient does report having multiple ticks on him over the last week. Patient reports no new rash but wife notes rash on chest is new.     Meds:  Current Meds  Medication Sig  . allopurinol (ZYLOPRIM) 100 MG tablet Take 100 mg by mouth daily.  Marland Kitchen ALPRAZolam (XANAX) 0.5 MG tablet Take 0.5 mg by mouth at bedtime as needed for anxiety or sleep.  Marland Kitchen amitriptyline (ELAVIL) 10 MG tablet Take 10 mg by mouth at bedtime.  Marland Kitchen ampicillin (PRINCIPEN) 500 MG capsule Take 500 mg by mouth in the morning and at bedtime.  Marland Kitchen atorvastatin (LIPITOR) 80 MG tablet Take 80 mg by mouth daily.  . cyclobenzaprine (FLEXERIL) 10 MG tablet Take 10 mg by mouth 3 (three) times daily as needed for muscle spasms.  . enalapril (VASOTEC) 20 MG tablet Take 20 mg by mouth daily.  . hydrochlorothiazide (HYDRODIURIL) 12.5 MG tablet Take 12.5 mg by mouth daily.  . Multiple Vitamin (MULTIVITAMIN WITH MINERALS) TABS tablet Take 1 tablet by mouth daily.   Allergies: Allergies as of 03/18/2021  . (No Known Allergies)   Past Medical History:  Diagnosis Date  . Anemia   .  Anxiety   . CKD (chronic kidney disease)   . Gout   . Hepatic steatosis   . Hyperlipidemia   . Hypertension   . Prediabetes     Family History:  Father - dementia, deceased at age 79 Mother - stroke (in 40's and late 59s), deceased from breast cancer  Uncle - renal failure deceased from this, unknown age  Social History:  Lives at home with wife. 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 Notes prior history of tobacco use, none over the past 20 years Endorses alcohol use - "not very often" No extramarital affairs, no history of STI's, no illicit drug use.   Review of Systems: A complete ROS was negative except as per HPI.  Review of Systems  Constitutional:  Positive for chills and diaphoresis. Negative for fever.  Eyes: Positive for photophobia.  Respiratory: Negative for shortness of breath.   Cardiovascular: Negative for chest pain.  Gastrointestinal: Negative for abdominal pain, blood in stool, constipation, diarrhea, nausea and vomiting.  Genitourinary: Negative for hematuria.  Musculoskeletal: Positive for falls and neck pain.  Skin: Positive for rash. Negative for itching.  Neurological: Positive for headaches. Negative for dizziness.  Psychiatric/Behavioral: Positive for substance abuse. The patient has insomnia.     Physical Exam: Blood pressure (!) 103/55, pulse 81, temperature 98.2 F (36.8 C), resp. rate 13, SpO2 94 %. Physical Exam Neck:     Meningeal: Brudzinski's sign and Kernig's sign absent.  Cardiovascular:     Rate and Rhythm: Normal rate and regular rhythm.     Pulses:          Radial pulses are 3+ on the right side and 3+ on the left side.       Dorsalis pedis pulses are 1+ on the right side and 1+ on the left side.     Comments: Fixed split S2  Pulmonary:     Effort: Pulmonary effort is normal.     Breath sounds: Examination of the right-lower field reveals rales. Rales present.  Abdominal:     General: There is no distension.     Palpations: There is mass.     Tenderness: There is no abdominal tenderness. There is no guarding.     Comments: Non-tender mass in the RLQ - possibly a hernia v stool Asterixis present  Musculoskeletal:     Cervical back: Normal range of motion.     Right lower leg: No edema.     Left lower leg: No edema.  Skin:    Comments: Red papules with central ulceration cover chest and upper abdomen, patient states they do no itch but scratches at them intermittently throughout exam.  Scattered bruising across chest. Red flushed face.  Scars present on knees bilaterally from prior knee replacements.  Skin on feet bilaterally cool to the touch.    Neurological:     Mental Status: He is  disoriented and confused.     Cranial Nerves: No cranial nerve deficit, dysarthria or facial asymmetry.     Sensory: No sensory deficit.     Motor: No tremor.     Coordination: Coordination abnormal. Finger-Nose-Finger Test abnormal.     Comments: Negative Kernig's and Brudzinski's sign.   Psychiatric:        Mood and Affect: Affect is inappropriate.        Speech: Speech is tangential.        Behavior: Behavior is hyperactive.        Judgment: Judgment is inappropriate.  EKG: personally reviewed my interpretation is normal rate, ventricular conduction block.   CXR: personally reviewed my interpretation is no consolidations, no evidence of fracture, no evidence of pulmonary edema.   CMP Latest Ref Rng & Units 03/18/2021  Glucose 70 - 99 mg/dL 95  BUN 8 - 23 mg/dL 44(W)  Creatinine 1.02 - 1.24 mg/dL 7.25(D)  Sodium 664 - 403 mmol/L 139  Potassium 3.5 - 5.1 mmol/L 4.6  Chloride 98 - 111 mmol/L 102  CO2 22 - 32 mmol/L 21(L)  Calcium 8.9 - 10.3 mg/dL 8.9  Total Protein 6.5 - 8.1 g/dL 7.4  Total Bilirubin 0.3 - 1.2 mg/dL 4.7(Q)  Alkaline Phos 38 - 126 U/L 89  AST 15 - 41 U/L 261(H)  ALT 0 - 44 U/L 108(H)   CBC    Component Value Date/Time   WBC 8.8 03/18/2021 1332   RBC 3.84 (L) 03/18/2021 1332   HGB 12.7 (L) 03/18/2021 1332   HCT 39.3 03/18/2021 1332   PLT 213 03/18/2021 1332   MCV 102.3 (H) 03/18/2021 1332   MCH 33.1 03/18/2021 1332   MCHC 32.3 03/18/2021 1332   RDW 12.6 03/18/2021 1332   LYMPHSABS 1.0 03/18/2021 1332   MONOABS 0.6 03/18/2021 1332   EOSABS 0.3 03/18/2021 1332   BASOSABS 0.0 03/18/2021 1332    Ref Range & Units 14:14  Ammonia 9 - 35 umol/L 55High     Ref Range & Units 15:18  Total CK 49 - 397 U/L 9,562High     IMPRESSION: CT of the head: No acute intracranial abnormality is noted. Mucosal thickening is noted within the left maxillary antrum. CT of the cervical spine: Multilevel degenerative change without acute abnormality.  Renal  US IMPRESSION: No renal abnormality is noted bilaterally.  RUQ US IMPRESSION: 1. Diffusely echogenic liver consistent with hepatic steatosis and or hepatocellular disease. 2. Negative for gallstones.  Assessment & Plan by Problem: Active Problems:   Altered mental status  Sajjad Honea is a 70 year old male with a history of CKD, hepatic steatosis, anemia, anxiety, HTN, HLD, and prediabetes who presented to the ED with 2.5 days of AMS admitted for acute renal and hepatic failure with AMS for further work up.   AMS Patient presents with 2.5 days of acute onset worsening AMS. In combination with his conduction block identified on EKG, transaminitis, acute renal failure, rash, rhabdomyolisis, and history of multiple tick bites concern high for tick borne illness specifically lyme v RMSF.  However, given hyperactive delirium also considering hyperthyroidism. Additionally considering syphilis, HIV, intoxication, vasculitis, and encephalitis.  It is also possible that this is due to amitriptyline given the timing of the onset. Though this would not explain why he has continued to worsen since discontinuation of medication or his new macrocytic anemia.  Unlikely to be psychiatric in nature given no history of psychiatric disease in him or family and this also would not explain his other acute symptoms (renal failure, transaminitis).  Spoke with ID and they recommended starting doxy now and getting LP.  - RPR - HIV testing - TSH and T4 - LDH  - RMSF serologies - lyme serologies  - start doxycycline - LP - UDS - MRI brain w/o contrast - blood smear - ANA ans ANCA titers   Acute renal failure  Anion gap metabolic acidosis  On admission patient's creatinine was 6.52, baseline with CKD is 1.19. BUN at 86, GFR 9. Unlikely to be related to CKD.Uremia likely source of anion gap acidosis ( CO2  decreased to 21, with gap of 16.). Normal renal US. No evidence of masses or hydronephrosis.   -  trend CMP - UA - consult nephrology   Transaminitis   Hyperammoniemia  AST 261 and ALT 108 (ratio >2:1). Concerning for alcohol induced liver disease given ratio, however patient reports minimal alcohol intake. He is not a clear historian at this time though and alcohol intake may be higher in actuality. However, acute decompensation and prior normal LFTs raise the concern for acute process possibly acute hepatitis or tick borne illness.  RUQ [place] consistent with hepatic steatosis v hepatocellular disease.  - hepatitis panel  - APTT now and in AM - INR now and in AM - bilirubin fractionated  - GGT  Rhabdomyolysis  CK elevated to 9,562. Received 2L bolus in ED. Not on any medications that would liekly cause this. No recent increased activity.  - trend CK  - LR 125 cc/hr  Conduction block  Conduction block identified on EKG in ED. No prior to compare to. Concern to be related possible tickborne illness.  - cardiac monitoring - work up + ppx treatment for tick borne illness as descried above   Macrocytic anemia  Patient with history of chronic normocytic anemia presents with new macrocytic anemia.  - B12 level - folate level  - trend CBC - retic count - blood smear  Gout - hold home allopurinol  HTN - hold home HCTZ and enalapril     Diet: heart healthy  IVF: LR 125 cc/hr DVT ppx: heparin    Dispo: Admit patient to Inpatient with expected length of stay greater than 2 midnights.  Signed: Laverna Peace, Medical Student 03/18/2021, 6:35 PM  Pager: [phone]

## 2021-03-18 NOTE — H&P (Signed)
Date: 03/18/2021               Patient Name:  Noah Pacheco MRN: [id number]  DOB: 09-08-1951 Age / Sex: 70 y.o., male   PCP: Gwenlyn Found, MD              Medical Service: Internal Medicine Teaching Service              Attending Physician: Dr. [name], MD    First Contact: Laverna Peace, MS4 Pager: [phone]  Second Contact: Dr. Eliezer Bottom  Pager: [phone]  Third Contact Dr. Evlyn Kanner Pager: [phone]       After Hours (After 5p/  First Contact Pager: [phone]  weekends / holidays): Second Contact Pager: [phone]   Chief Complaint: altered mental status   History of Present Illness: [name] is a 70 year old male with a history of CKD and fatty liver disease who presented to the ED from his PCP office due to 2.5 days of altered mental status. The majority of the history was taken from the wife at bedside as patient was still significantly altered.  Wife reports that the patient began taking amitriptyline on Sunday and Monday, he began complaining of headaches shortly after starting this medication. She says that since Monday evening he has been acting not himself. She called the pharmacist and medication was discontinued. However the patient has had progressively worsening AMS since Monday.  Acting illogically and making random comments that do not make sense.  On Tuesday evening, she states that patient had bad headache with photophobia with altered mental status. Of note he did fall Tuesday morning and hit his head on concrete floor, however headaches preceded fall.   Then Wednesday patient was home alone during the day while wife was at work. When she returned home that evening time he was extremely agitated and altered. (Hyperactive delirium) The house was a mess as if he had gone through drawers and just moved things around in ways that were not logical.   Has history of renal disease in the past and was ?treated and has been followed by [facility].  No similar  episodes like this in the past.  No fevers at home but did note to be "shaky and clammy". No nausea/vomiting. No abdominal pain/constipation/diarrhea. No hematuria or hematochezia  Patient does report having multiple ticks on him over the last week. Patient reports no new rash but wife notes rash on chest is new.     Meds:  Current Meds  Medication Sig  . allopurinol (ZYLOPRIM) 100 MG tablet Take 100 mg by mouth daily.  Marland Kitchen ALPRAZolam (XANAX) 0.5 MG tablet Take 0.5 mg by mouth at bedtime as needed for anxiety or sleep.  Marland Kitchen amitriptyline (ELAVIL) 10 MG tablet Take 10 mg by mouth at bedtime.  Marland Kitchen ampicillin (PRINCIPEN) 500 MG capsule Take 500 mg by mouth in the morning and at bedtime.  Marland Kitchen atorvastatin (LIPITOR) 80 MG tablet Take 80 mg by mouth daily.  . cyclobenzaprine (FLEXERIL) 10 MG tablet Take 10 mg by mouth 3 (three) times daily as needed for muscle spasms.  . enalapril (VASOTEC) 20 MG tablet Take 20 mg by mouth daily.  . hydrochlorothiazide (HYDRODIURIL) 12.5 MG tablet Take 12.5 mg by mouth daily.  . Multiple Vitamin (MULTIVITAMIN WITH MINERALS) TABS tablet Take 1 tablet by mouth daily.   Allergies: Allergies as of 03/18/2021  . (No Known Allergies)   Past Medical History:  Diagnosis Date  . Anemia   .  Anxiety   . CKD (chronic kidney disease)   . Gout   . Hepatic steatosis   . Hyperlipidemia   . Hypertension   . Prediabetes     Family History:  Father - dementia, deceased at age 79 Mother - stroke (in 40's and late 59s), deceased from breast cancer  Uncle - renal failure deceased from this, unknown age  Social History:  Lives at home with wife. 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 Notes prior history of tobacco use, none over the past 20 years Endorses alcohol use - "not very often" No extramarital affairs, no history of STI's, no illicit drug use.   Review of Systems: A complete ROS was negative except as per HPI.  Review of Systems  Constitutional:  Positive for chills and diaphoresis. Negative for fever.  Eyes: Positive for photophobia.  Respiratory: Negative for shortness of breath.   Cardiovascular: Negative for chest pain.  Gastrointestinal: Negative for abdominal pain, blood in stool, constipation, diarrhea, nausea and vomiting.  Genitourinary: Negative for hematuria.  Musculoskeletal: Positive for falls and neck pain.  Skin: Positive for rash. Negative for itching.  Neurological: Positive for headaches. Negative for dizziness.  Psychiatric/Behavioral: Positive for substance abuse. The patient has insomnia.     Physical Exam: Blood pressure (!) 103/55, pulse 81, temperature 98.2 F (36.8 C), resp. rate 13, SpO2 94 %. Physical Exam Neck:     Meningeal: Brudzinski's sign and Kernig's sign absent.  Cardiovascular:     Rate and Rhythm: Normal rate and regular rhythm.     Pulses:          Radial pulses are 3+ on the right side and 3+ on the left side.       Dorsalis pedis pulses are 1+ on the right side and 1+ on the left side.     Comments: Fixed split S2  Pulmonary:     Effort: Pulmonary effort is normal.     Breath sounds: Examination of the right-lower field reveals rales. Rales present.  Abdominal:     General: There is no distension.     Palpations: There is mass.     Tenderness: There is no abdominal tenderness. There is no guarding.     Comments: Non-tender mass in the RLQ - possibly a hernia v stool Asterixis present  Musculoskeletal:     Cervical back: Normal range of motion.     Right lower leg: No edema.     Left lower leg: No edema.  Skin:    Comments: Red papules with central ulceration cover chest and upper abdomen, patient states they do no itch but scratches at them intermittently throughout exam.  Scattered bruising across chest. Red flushed face.  Scars present on knees bilaterally from prior knee replacements.  Skin on feet bilaterally cool to the touch.    Neurological:     Mental Status: He is  disoriented and confused.     Cranial Nerves: No cranial nerve deficit, dysarthria or facial asymmetry.     Sensory: No sensory deficit.     Motor: No tremor.     Coordination: Coordination abnormal. Finger-Nose-Finger Test abnormal.     Comments: Negative Kernig's and Brudzinski's sign.   Psychiatric:        Mood and Affect: Affect is inappropriate.        Speech: Speech is tangential.        Behavior: Behavior is hyperactive.        Judgment: Judgment is inappropriate.  EKG: personally reviewed my interpretation is normal rate, ventricular conduction block.   CXR: personally reviewed my interpretation is no consolidations, no evidence of fracture, no evidence of pulmonary edema.   CMP Latest Ref Rng & Units 03/18/2021 03/18/2021  Glucose 70 - 99 mg/dL - 95  BUN 8 - 23 mg/dL - 78(H)  Creatinine 8.85 - 1.24 mg/dL - 0.27(X)  Sodium 412 - 145 mmol/L - 139  Potassium 3.5 - 5.1 mmol/L - 4.6  Chloride 98 - 111 mmol/L - 102  CO2 22 - 32 mmol/L - 21(L)  Calcium 8.9 - 10.3 mg/dL - 8.9  Total Protein 6.5 - 8.1 g/dL - 7.4  Total Bilirubin 0.3 - 1.2 mg/dL 8.7(O) 1.3(H)  Alkaline Phos 38 - 126 U/L - 89  AST 15 - 41 U/L - 261(H)  ALT 0 - 44 U/L - 108(H)   CBC    Component Value Date/Time   WBC 8.8 03/18/2021 1332   RBC 3.84 (L) 03/18/2021 1332   HGB 12.7 (L) 03/18/2021 1332   HCT 39.3 03/18/2021 1332   PLT 213 03/18/2021 1332   MCV 102.3 (H) 03/18/2021 1332   MCH 33.1 03/18/2021 1332   MCHC 32.3 03/18/2021 1332   RDW 12.6 03/18/2021 1332   LYMPHSABS 1.0 03/18/2021 1332   MONOABS 0.6 03/18/2021 1332   EOSABS 0.3 03/18/2021 1332   BASOSABS 0.0 03/18/2021 1332    Ref Range & Units 14:14  Ammonia 9 - 35 umol/L 55High     Ref Range & Units 15:18  Total CK 49 - 397 U/L 9,562High     IMPRESSION: CT of the head: No acute intracranial abnormality is noted. Mucosal thickening is noted within the left maxillary antrum. CT of the cervical spine: Multilevel degenerative change  without acute abnormality.  Renal US IMPRESSION: No renal abnormality is noted bilaterally.  RUQ US IMPRESSION: 1. Diffusely echogenic liver consistent with hepatic steatosis and or hepatocellular disease. 2. Negative for gallstones.  Assessment & Plan by Problem: Active Problems:   Altered mental status  Miking Usrey is a 70 year old male with a history of CKD, hepatic steatosis, anemia, anxiety, HTN, HLD, and prediabetes who presented to the ED with 2.5 days of AMS admitted for acute renal and hepatic failure with AMS for further work up.   AMS Patient presents with 2.5 days of acute onset worsening AMS. In combination with his conduction block identified on EKG, transaminitis, acute renal failure, rash, rhabdomyolisis, and history of multiple tick bites concern high for tick borne illness specifically lyme v RMSF.  However, given hyperactive delirium also considering hyperthyroidism. Additionally considering syphilis, HIV, intoxication, vasculitis, and encephalitis.  It is also possible that this is due to amitriptyline given the timing of the onset. Though this would not explain why he has continued to worsen since discontinuation of medication or his new macrocytic anemia.  Unlikely to be psychiatric in nature given no history of psychiatric disease in him or family and this also would not explain his other acute symptoms (renal failure, transaminitis).  Spoke with ID and they recommended starting doxy now and getting LP.  - RPR - HIV testing - TSH and T4 - LDH  - RMSF serologies - lyme serologies  - start doxycycline - LP - UDS - MRI brain w/o contrast - blood smear - ANA ans ANCA titers   Acute renal failure  Anion gap metabolic acidosis  On admission patient's creatinine was 6.52, baseline with CKD is 1.19. BUN at 86, GFR 9.  Unlikely to be related to CKD.Uremia likely source of anion gap acidosis ( CO2 decreased to 21, with gap of 16.). Normal renal US. No evidence  of masses or hydronephrosis.   - trend CMP - UA - consult nephrology   Transaminitis   Hyperammoniemia  AST 261 and ALT 108 (ratio >2:1). Concerning for alcohol induced liver disease given ratio, however patient reports minimal alcohol intake. He is not a clear historian at this time though and alcohol intake may be higher in actuality. However, acute decompensation and prior normal LFTs raise the concern for acute process possibly acute hepatitis or tick borne illness.  RUQ [place] consistent with hepatic steatosis v hepatocellular disease.  - hepatitis panel  - APTT now and in AM - INR now and in AM - bilirubin fractionated  - GGT  Rhabdomyolysis  CK elevated to 9,562. Received 2L bolus in ED. Not on any medications that would liekly cause this. No recent increased activity.  - trend CK  - LR 125 cc/hr  Conduction block  Conduction block identified on EKG in ED. No prior to compare to. Concern to be related possible tickborne illness.  - cardiac monitoring - work up + ppx treatment for tick borne illness as descried above   Macrocytic anemia  Patient with history of chronic normocytic anemia presents with new macrocytic anemia.  - B12 level - folate level  - trend CBC - retic count - blood smear  Gout - hold home allopurinol  HTN - hold home HCTZ and enalapril     Diet: heart healthy  IVF: LR 125 cc/hr DVT ppx: heparin    Dispo: Admit patient to Inpatient with expected length of stay greater than 2 midnights.  Signed: Laverna Peace, Medical Student  03/18/2021, 8:06 PM  Pager: [phone]   Attestation for Student Documentation:  I personally was present and performed or re-performed the history, physical exam and medical decision-making activities of this service and have verified that the service and findings are accurately documented in the student's note.  Attestation for Student Documentation:  I personally was present and performed or re-performed the  history, physical exam and medical decision-making activities of this service and have verified that the service and findings are accurately documented in the student's note.  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 Patient was recently started on amitryptiline and took this for two days prior to onset of symptoms. He was also noted to have a tick bite about one week prior. He notes that he frequently gets tick bites. Patient was noted to have significant AKI on CKD (sCr 6.2 up from baseline of 1.22) and acute liver injury (AST/ALT 261/108 with mild elevation in bilirubin). He did have nonspecific conduction delays on EKG, however, no baseline EKG to compare this to. He also is noted to have a new macrocytic anemia. In setting of altered mental status, acute renal injury with elevated CK suggestive of rhabdomyolysis, and acute liver injury with new macrocytic anemia, concerns for early disseminated Lyme disease given recent exposure to ticks. Discussed with infectious disease and recommended to start treatment with doxycycline.  Neurology consulted, at this time, no benefit to performing LP as the results would not return for up to one week and given clinical presentation, recommended for correction of metabolic  imbalances.   Differential also includes autoimmune process vs CVA for which MRI brain is obtained and will follow up on ANA/ANCA. Hepatitis panel to follow up on acute liver injury. Urinalysis with mild ketonuria, and moderate hemoglobinuria without bacteruria consistent with rhabdomyolysis. UDS is positive for opiates and this, in conjunction with decreased oral intake, may have contributed to rhabdomyolysis. Will give IV fluid resuscitation for  rhabdomyolysis and ALI and follow up AM labs.  Also checking thyroid labs.   Eliezer BottomAslam, Deeanna Beightol, MD IMTS PGY-2 03/18/2021, 7:38 PM

## 2021-03-18 NOTE — Consult Note (Signed)
Neurology Consultation  Reason for Consult: Altered mental status Referring Physician: Dr. Mcarthur Rossetti, IM resident physician/Dr. Criselda Peaches, IM attending  CC: Altered mental status  History is obtained from: Chart, patient, patient's wife at bedside  HPI: Noah Pacheco is a 70 y.o. male with a past medical history of CKD, hepatic steatosis, hypertension, hyperlipidemia, anxiety, presents to the emergency room for evaluation of sudden onset of altered mental status over the past 48 hours. He is recently retired, owned a Runner, [employer].  Was in usual state of health till this past Tuesday when his wife started noticing that he is acting somewhat out of character for himself-appeared confused.  This all started after he started complaining of bad headache.  His headache was involving all of his head, did not get relieved with medications.  He also started complaining of some neck pain.  Yesterday, he was acting a little bit more strange compared to the day prior and today he started acting more bizarrely, opening all the doors of the house, saying that he is going to take his dog to the school and such which made his wife concerned and had him come to the emergency department for evaluation.  In the emergency department, it was noted that his creatinine was greater than 6, at baseline he has mild CKD but this was way above his normal range.  He was also noted to have increased ammonia and evidence of acute liver injury which are all new findings.  He complained of a fall-hence a CT head and CT neck were done that was unremarkable.  He was admitted to the internal medicine teaching service, who on further history taking found that he had some exposure to ticks recently at his house in [facility]. Wife reports that this is not something new and not something that has not happened before.  He has not traveled to the [facility]. No fevers no chills.  He has had a cough and respiratory symptoms over  the past few days which have also gotten worse according to the wife. At this time, he does not complain of a bad headache but the wife says that all this trouble started headache followed by neck pain followed by some confusion. I have been told that infectious disease has been consulted and are recommending a spinal tap be done to rule out CNS infection- take borne illness. He had an MRI of the brain completed-without contrast given his deranged renal status which revealed a small focus of acute to subacute infarction in the left parietal periventricular white matter-but on my review that has not begin after explaining a sudden and drastic change in his cognition or mentation  LKW: Greater than 48 hours ago tpa given?: no, outside the window Premorbid modified Rankin scale (mRS): 0   ROS: Unable to obtain due to altered mental status.   Past Medical History:  Diagnosis Date  . Anemia   . Anxiety   . CKD (chronic kidney disease)   . Gout   . Hepatic steatosis   . Hyperlipidemia   . Hypertension   . Prediabetes     History reviewed. No pertinent family history.   Social History:   has no history on file for tobacco use, alcohol use, and drug use.  Medications  Current Facility-Administered Medications:  .  ALPRAZolam (XANAX) tablet 0.5 mg, 0.5 mg, Oral, QHS PRN, Aslam, Sadia, MD .  Melene Muller ON 03/19/2021] doxycycline (VIBRA-TABS) tablet 100 mg, 100 mg, Oral, Q12H, [name], MD .  doxycycline (VIBRAMYCIN) 200 mg in dextrose 5 % 250 mL IVPB, 200 mg, Intravenous, Once, Aslam, Sadia, MD .  heparin injection 5,000 Units, 5,000 Units, Subcutaneous, Q8H, Aslam, Sadia, MD, 5,000 Units at 03/18/21 1852 .  [COMPLETED] lactated ringers bolus 1,000 mL, 1,000 mL, Intravenous, Once, Last Rate: 999 mL/hr at 03/18/21 1817, 1,000 mL at 03/18/21 1817 **FOLLOWED BY** [COMPLETED] lactated ringers bolus 1,000 mL, 1,000 mL, Intravenous, Once, Last Rate: 999 mL/hr at 03/18/21 1817, 1,000 mL at  03/18/21 1817 **FOLLOWED BY** lactated ringers infusion, , Intravenous, Continuous, Aslam, Sadia, MD, Last Rate: 125 mL/hr at 03/18/21 1818, New Bag at 03/18/21 1818 .  sodium chloride flush (NS) 0.9 % injection 3 mL, 3 mL, Intravenous, Q12H, Aslam, Sadia, MD  Current Outpatient Medications:  .  allopurinol (ZYLOPRIM) 100 MG tablet, Take 100 mg by mouth daily., Disp: , Rfl:  .  ALPRAZolam (XANAX) 0.5 MG tablet, Take 0.5 mg by mouth at bedtime as needed for anxiety or sleep., Disp: , Rfl:  .  amitriptyline (ELAVIL) 10 MG tablet, Take 10 mg by mouth at bedtime., Disp: , Rfl:  .  ampicillin (PRINCIPEN) 500 MG capsule, Take 500 mg by mouth in the morning and at bedtime., Disp: , Rfl:  .  atorvastatin (LIPITOR) 80 MG tablet, Take 80 mg by mouth daily., Disp: , Rfl:  .  cyclobenzaprine (FLEXERIL) 10 MG tablet, Take 10 mg by mouth 3 (three) times daily as needed for muscle spasms., Disp: , Rfl:  .  enalapril (VASOTEC) 20 MG tablet, Take 20 mg by mouth daily., Disp: , Rfl:  .  hydrochlorothiazide (HYDRODIURIL) 12.5 MG tablet, Take 12.5 mg by mouth daily., Disp: , Rfl:  .  Multiple Vitamin (MULTIVITAMIN WITH MINERALS) TABS tablet, Take 1 tablet by mouth daily., Disp: , Rfl:    Exam: Current vital signs: BP (!) 103/55   Pulse 81   Temp 98.2 F (36.8 C)   Resp 13   SpO2 94%  Vital signs in last 24 hours: Temp:  [98.2 F (36.8 C)] 98.2 F (36.8 C) (05/05 1259) Pulse Rate:  [78-81] 81 (05/05 1630) Resp:  [13-19] 13 (05/05 1630) BP: (103-132)/(55-76) 103/55 (05/05 1630) SpO2:  [92 %-96 %] 94 % (05/05 1630) General: Awake, alert, oriented x3 HEENT: Normocephalic atraumatic, supple neck CVS: Regular rhythm Chest clear Abdomen: Soft nondistended nontender Extremities warm well perfused Skin: There are some rashes on the beltline where the wife says that she took Sympt-X out. Neurological exam Awake alert rather hurried speech. Oriented to month. Not oriented to place- said is in a hospital in  [place].  Upon correcting him that he is in [place], he still could not tell me the name of the hospital correctly. Initially said it is 2020, on asking him to try again, he is a 2022. Upon asking who the president is, he said Felix Pacini.  Then his wife had to tell him and prompted him before he could come up with Duane Boston as the correct answer. No aphasia Diminished attention concentration No dysarthria Cranial nerves: Pupils equal round react light, extraocular movements intact, visual fields full, face appears symmetric, facial sensation intact, tongue and palate midline. Motor exam: 5/5 in all 4 extremities without drift Coordination: No finger-nose-finger dysmetria noted Sensory exam: Intact to touch all over Gait testing deferred at this time NIH stroke scale-0  Labs I have reviewed labs in epic and the results pertinent to this consultation are:  CBC    Component Value Date/Time   WBC 8.8  03/18/2021 1332   RBC 3.84 (L) 03/18/2021 1332   HGB 12.7 (L) 03/18/2021 1332   HCT 39.3 03/18/2021 1332   PLT 213 03/18/2021 1332   MCV 102.3 (H) 03/18/2021 1332   MCH 33.1 03/18/2021 1332   MCHC 32.3 03/18/2021 1332   RDW 12.6 03/18/2021 1332   LYMPHSABS 1.0 03/18/2021 1332   MONOABS 0.6 03/18/2021 1332   EOSABS 0.3 03/18/2021 1332   BASOSABS 0.0 03/18/2021 1332    CMP     Component Value Date/Time   NA 139 03/18/2021 1332   K 4.6 03/18/2021 1332   CL 102 03/18/2021 1332   CO2 21 (L) 03/18/2021 1332   GLUCOSE 95 03/18/2021 1332   BUN 86 (H) 03/18/2021 1332   CREATININE 6.52 (H) 03/18/2021 1332   CALCIUM 8.9 03/18/2021 1332   PROT 7.4 03/18/2021 1332   ALBUMIN 4.2 03/18/2021 1332   AST 261 (H) 03/18/2021 1332   ALT 108 (H) 03/18/2021 1332   ALKPHOS 89 03/18/2021 1332   BILITOT 1.5 (H) 03/18/2021 1820   GFRNONAA 9 (L) 03/18/2021 1332  Baseline creatinine 1.2-1.3, current level 6.52 with BUN 86.  AST ALT also elevated. Ammonia 55  Imaging I have reviewed the  images obtained:  CT-scan of the brain, CT C-spine-no acute abnormalities.  Mucosal thickening in the left maxillary antrum.  MRI examination of the brain- punctate acute ischemic stroke in the left parietal periventricular white matter.  No hemorrhage or mass-effect.  EKG with conduction block  Assessment: 70 year old man with history of CKD, hepatic status, hypertension, hyperlipidemia, anxiety presenting for worsening confusion of sudden onset within last 48 hours. No focal findings on examination. MRI examination with a punctate acute ischemic stroke in the left periventricular parietal white matter-not begin after explain current findings. Laboratory findings although are severely deranged-BUN, creatinine, liver function tests are all deranged severely. Current clinical presentation could be secondary to toxic metabolic encephalopathy from the deranged liver and kidney functions but case has been discussed with ID, and due to some tick bites reported, multisystem involvement, new conduction block on EKG, concern for tickborne CNS infection.  He has been started on doxycycline for that per ID. Given the rather sudden onset of altered mental status, preceding cough symptoms, some headache that is still persistent although better than before, I would agree with the differentials of a CNS infection being on top along with the toxic metabolic encephalopathy. The stroke on the MRI is not big enough to explain his symptoms. Spinal tap should be considered seriously.  Impression: Acute onset of encephalopathy-toxic metabolic encephalopathy in the setting of deranged renal function and hepatic function versus CNS infection Stroke on MRI- unclear clinical significance  Recommendations:  Empiric meningitic/encephalitic coverage-have been informed by pharmacy that his renal function is way too bad for acyclovir.  I have recommended that we hold that for now.  LP -IR guided tap has been ordered.    Continue doxycycline per ID  Correction of deranged renal and liver function per primary team as you are  Although likely to be low yield, send for Lyme serology-agree with ID with other test that they have ordered.  Reasonable to evaluate for any vasculitic process but clinical course is way to acute for her to be a vasculitic phenomenon  MR brain with contrast would be very helpful but given his severely deranged renal function, that is not a possibility at this time. Discussed with internal medicine teaching service as an physicians covering the patient over secure chat.  --  Amie Portland, MD Neurologist [facility] Pager: [phone]

## 2021-03-18 NOTE — Progress Notes (Signed)
Pharmacy Antibiotic Note  Noah Pacheco is a 70 y.o. male admitted on 03/18/2021 presenting with AMS, concern for meningitis.  Pharmacy has been consulted for vancomycin dosing.  Plan: Vancomycin 1500 mg IV x 1, then variable dosing due to unstable renal function Monitor renal fxn, meningitis workup and need for further doses Vancomycin random level as needed     Temp (24hrs), Avg:98.3 F (36.8 C), Min:98.2 F (36.8 C), Max:98.4 F (36.9 C)  Recent Labs  Lab 03/18/21 1332 03/18/21 1547  WBC 8.8  --   CREATININE 6.52*  --   LATICACIDVEN  --  1.5    CrCl cannot be calculated (Unknown ideal weight.).    No Known Allergies  Daylene Posey, PharmD Clinical Pharmacist ED Pharmacist Phone # [phone] 03/18/2021 8:58 PM

## 2021-03-18 NOTE — ED Notes (Signed)
Pt to mri 

## 2021-03-19 ENCOUNTER — Encounter (HOSPITAL_COMMUNITY): Payer: Self-pay | Admitting: Internal Medicine

## 2021-03-19 ENCOUNTER — Other Ambulatory Visit: Payer: Self-pay

## 2021-03-19 DIAGNOSIS — R7401 Elevation of levels of liver transaminase levels: Secondary | ICD-10-CM | POA: Diagnosis present

## 2021-03-19 DIAGNOSIS — G9340 Encephalopathy, unspecified: Secondary | ICD-10-CM | POA: Diagnosis present

## 2021-03-19 DIAGNOSIS — I459 Conduction disorder, unspecified: Secondary | ICD-10-CM

## 2021-03-19 DIAGNOSIS — N179 Acute kidney failure, unspecified: Secondary | ICD-10-CM | POA: Diagnosis present

## 2021-03-19 LAB — HEMOGLOBIN A1C
Hgb A1c MFr Bld: 5.8 % — ABNORMAL HIGH (ref 4.8–5.6)
Mean Plasma Glucose: 119.76 mg/dL

## 2021-03-19 LAB — PROTIME-INR
INR: 1.1 (ref 0.8–1.2)
Prothrombin Time: 14.2 seconds (ref 11.4–15.2)

## 2021-03-19 LAB — TSH: TSH: 1.284 u[IU]/mL (ref 0.350–4.500)

## 2021-03-19 LAB — CSF CELL COUNT WITH DIFFERENTIAL
RBC Count, CSF: 378 /mm3 — ABNORMAL HIGH
RBC Count, CSF: 455 /mm3 — ABNORMAL HIGH
Tube #: 1
Tube #: 4
WBC, CSF: 1 /mm3 (ref 0–5)
WBC, CSF: 2 /mm3 (ref 0–5)

## 2021-03-19 LAB — COMPREHENSIVE METABOLIC PANEL WITH GFR
ALT: 95 U/L — ABNORMAL HIGH (ref 0–44)
AST: 193 U/L — ABNORMAL HIGH (ref 15–41)
Albumin: 3.6 g/dL (ref 3.5–5.0)
Alkaline Phosphatase: 80 U/L (ref 38–126)
Anion gap: 10 (ref 5–15)
BUN: 70 mg/dL — ABNORMAL HIGH (ref 8–23)
CO2: 23 mmol/L (ref 22–32)
Calcium: 8.8 mg/dL — ABNORMAL LOW (ref 8.9–10.3)
Chloride: 102 mmol/L (ref 98–111)
Creatinine, Ser: 2.73 mg/dL — ABNORMAL HIGH (ref 0.61–1.24)
GFR, Estimated: 24 mL/min — ABNORMAL LOW
Glucose, Bld: 99 mg/dL (ref 70–99)
Potassium: 3.8 mmol/L (ref 3.5–5.1)
Sodium: 135 mmol/L (ref 135–145)
Total Bilirubin: 1.4 mg/dL — ABNORMAL HIGH (ref 0.3–1.2)
Total Protein: 6.5 g/dL (ref 6.5–8.1)

## 2021-03-19 LAB — LIPID PANEL
Cholesterol: 135 mg/dL (ref 0–200)
HDL: 39 mg/dL — ABNORMAL LOW (ref >40)
LDL Cholesterol: 58 mg/dL (ref 0–99)
Total CHOL/HDL Ratio: 3.5 RATIO
Triglycerides: 188 mg/dL — ABNORMAL HIGH (ref <150)
VLDL: 38 mg/dL (ref 0–40)

## 2021-03-19 LAB — CBC
HCT: 35.8 % — ABNORMAL LOW (ref 39.0–52.0)
Hemoglobin: 11.9 g/dL — ABNORMAL LOW (ref 13.0–17.0)
MCH: 33.1 pg (ref 26.0–34.0)
MCHC: 33.2 g/dL (ref 30.0–36.0)
MCV: 99.4 fL (ref 80.0–100.0)
Platelets: 187 10*3/uL (ref 150–400)
RBC: 3.6 MIL/uL — ABNORMAL LOW (ref 4.22–5.81)
RDW: 12.3 % (ref 11.5–15.5)
WBC: 7 10*3/uL (ref 4.0–10.5)
nRBC: 0 % (ref 0.0–0.2)

## 2021-03-19 LAB — CK: Total CK: 5599 U/L — ABNORMAL HIGH (ref 49–397)

## 2021-03-19 LAB — PROTEIN AND GLUCOSE, CSF
Glucose, CSF: 70 mg/dL (ref 40–70)
Total  Protein, CSF: 84 mg/dL — ABNORMAL HIGH (ref 15–45)

## 2021-03-19 LAB — SYPHILIS: RPR W/REFLEX TO RPR TITER AND TREPONEMAL ANTIBODIES, TRADITIONAL SCREENING AND DIAGNOSIS ALGORITHM: RPR Ser Ql: NONREACTIVE

## 2021-03-19 LAB — HEPATITIS PANEL, ACUTE
HCV Ab: NONREACTIVE
Hep A IgM: NONREACTIVE
Hep B C IgM: NONREACTIVE
Hepatitis B Surface Ag: NONREACTIVE

## 2021-03-19 LAB — PATHOLOGIST SMEAR REVIEW

## 2021-03-19 LAB — GLUCOSE, CAPILLARY: Glucose-Capillary: 106 mg/dL — ABNORMAL HIGH (ref 70–99)

## 2021-03-19 LAB — VITAMIN B12: Vitamin B-12: 719 pg/mL (ref 180–914)

## 2021-03-19 LAB — CRYPTOCOCCAL ANTIGEN, CSF: Crypto Ag: NEGATIVE

## 2021-03-19 LAB — FOLATE: Folate: 45.6 ng/mL (ref >5.9)

## 2021-03-19 LAB — APTT: aPTT: 30 s (ref 24–36)

## 2021-03-19 LAB — T4, FREE: Free T4: 0.91 ng/dL (ref 0.61–1.12)

## 2021-03-19 LAB — HIV ANTIBODY (ROUTINE TESTING W REFLEX): HIV Screen 4th Generation wRfx: NONREACTIVE

## 2021-03-19 MED ORDER — SODIUM CHLORIDE 0.9 % IV SOLN: 2.0000 g | INTRAVENOUS | Status: DC

## 2021-03-19 MED ORDER — LIDOCAINE HCL (PF) 1 % IJ SOLN
INTRAMUSCULAR | Status: AC
  Filled 2021-03-19: qty 5

## 2021-03-19 MED ORDER — AMLODIPINE BESYLATE 5 MG PO TABS
5.0000 mg | ORAL_TABLET | Freq: Every day | ORAL | Status: DC
  Administered 2021-03-19 – 2021-03-20 (×2): 5 mg via ORAL
  Filled 2021-03-19 (×2): qty 1

## 2021-03-19 MED ORDER — LIDOCAINE HCL (PF) 1 % IJ SOLN: 5.0000 mL | Freq: Once | INTRAMUSCULAR | Status: DC

## 2021-03-19 MED ORDER — SODIUM CHLORIDE 0.9 % IV SOLN
12.5000 mg | Freq: Once | INTRAVENOUS | Status: DC
  Filled 2021-03-19: qty 0.5

## 2021-03-19 MED ORDER — HEPARIN SODIUM (PORCINE) 5000 UNIT/ML IJ SOLN
5000.0000 [IU] | Freq: Three times a day (TID) | INTRAMUSCULAR | Status: DC
  Administered 2021-03-19 (×2): 5000 [IU] via SUBCUTANEOUS
  Filled 2021-03-19 (×3): qty 1

## 2021-03-19 MED ORDER — VANCOMYCIN HCL 1250 MG/250ML IV SOLN: 1250.0000 mg | INTRAVENOUS | Status: DC

## 2021-03-19 NOTE — Progress Notes (Signed)
Neurology Progress Note  Brief HPI: 70 y.o. male with PMHx CKD, hepatic steatosis, HTN, HLD, anxiety who presented to the ED 03/18/2021 for evaluation of sudden onset AMS over the 48h PTA. Patient was in normal state of health until 5/3 when his wife noticed he seemed altered and confused shortly after complaining of a headache and neck pain at home. 5/4 his wife noticed that he was progressively confused with bizarre behaviors. In the ED, he was found to have a significantly elevated creatinine above baseline, increased ammonia, and elevated LFTs. CTH was negative for acute intracranial abnormality; MRI with punctate acute ischemic stroke in the left parietal periventricular white matter- not likely the etiology of his acute encephalopathy. Concern for tick-borne illness due to multisystem involvement and EKG with a new conduction block; patient started on Doxycycline per ID.   Subjective: Patient with much improvement of his mental status today with minimal reported residual confusion but with persistent posterior headaches and neck pain and stiffness.  Negative Kernig and Brudzinski's signs on assessment   Exam: Vitals:   03/18/21 2258 03/19/21 0623  BP: (!) 143/80 136/65  Pulse: 73 69  Resp: 18 16  Temp: 98.4 F (36.9 C) 98.3 F (36.8 C)  SpO2: 94% 98%   Gen: Laying in bed watching television, in no acute distress Resp: non-labored breathing, no respiratory distress Abd: soft, non-tender, non-distended  Neuro: Mental Status: Awake and alert with seemingly pressured speech. Oriented to self, age, month, year, and location. He is able to provide details of present illness from what witnesses have told him but is unable to provide further details of recollection during his confusion over the last 2 days. He is not dysarthric or aphasic on examination and he is without neglect.  CN: Pupils are 3 mm, equal, round, and briskly reactive to light bilaterally, EOMI without ptosis, visual fields  are full, facial sensation is intact and symmetric to light touch, face is symmetric resting and smiling, hearing is intact to voice, shoulder shrug is symmetric, phonation normal, symmetric palate elevation, tongue protrudes midline without fasciculations. Motor: 5/5 strength throughout each extremity without vertical drift. Tone and bulk are normal.  Sensory: Sensation to light touch is intact and symmetric in bilateral upper and lower extremities. Coordination: FNF intact  Gait: Deferred  Pertinent Labs: CBC    Component Value Date/Time   WBC 7.0 03/18/2021 2344   RBC 3.60 (L) 03/18/2021 2344   HGB 11.9 (L) 03/18/2021 2344   HCT 35.8 (L) 03/18/2021 2344   PLT 187 03/18/2021 2344   MCV 99.4 03/18/2021 2344   MCH 33.1 03/18/2021 2344   MCHC 33.2 03/18/2021 2344   RDW 12.3 03/18/2021 2344   LYMPHSABS 1.0 03/18/2021 1332   MONOABS 0.6 03/18/2021 1332   EOSABS 0.3 03/18/2021 1332   BASOSABS 0.0 03/18/2021 1332   CMP     Component Value Date/Time   NA 135 03/18/2021 2344   K 3.8 03/18/2021 2344   CL 102 03/18/2021 2344   CO2 23 03/18/2021 2344   GLUCOSE 99 03/18/2021 2344   BUN 70 (H) 03/18/2021 2344   CREATININE 2.73 (H) 03/18/2021 2344   CALCIUM 8.8 (L) 03/18/2021 2344   PROT 6.5 03/18/2021 2344   ALBUMIN 3.6 03/18/2021 2344   AST 193 (H) 03/18/2021 2344   ALT 95 (H) 03/18/2021 2344   ALKPHOS 80 03/18/2021 2344   BILITOT 1.4 (H) 03/18/2021 2344   GFRNONAA 24 (L) 03/18/2021 2344   No results found for: CHOL, HDL, LDLCALC, LDLDIRECT,  TRIG, CHOLHDL Lab Results  Component Value Date   HGBA1C 5.8 (H) 03/18/2021   Results for orders placed or performed during the hospital encounter of 03/18/21  Resp Panel by RT-PCR (Flu A&B, Covid) Nasopharyngeal Swab     Status: None   Collection Time: 03/18/21  3:04 PM   Specimen: Nasopharyngeal Swab; Nasopharyngeal(NP) swabs in vial transport medium  Result Value Ref Range Status   SARS Coronavirus 2 by RT PCR NEGATIVE NEGATIVE Final     Comment: (NOTE) SARS-CoV-2 target nucleic acids are NOT DETECTED.  The SARS-CoV-2 RNA is generally detectable in upper respiratory specimens during the acute phase of infection. The lowest concentration of SARS-CoV-2 viral copies this assay can detect is 138 copies/mL. A negative result does not preclude SARS-Cov-2 infection and should not be used as the sole basis for treatment or other patient management decisions. A negative result may occur with  improper specimen collection/handling, submission of specimen other than nasopharyngeal swab, presence of viral mutation(s) within the areas targeted by this assay, and inadequate number of viral copies(<138 copies/mL). A negative result must be combined with clinical observations, patient history, and epidemiological information. The expected result is Negative.  Fact Sheet for Patients:  BloggerCourse.com  Fact Sheet for Healthcare Providers:  SeriousBroker.it  This test is no t yet approved or cleared by the [place] FDA and  has been authorized for detection and/or diagnosis of SARS-CoV-2 by FDA under an Emergency Use Authorization (EUA). This EUA will remain  in effect (meaning this test can be used) for the duration of the COVID-19 declaration under Section 564(b)(1) of the Act, 21 U.S.C.section 360bbb-3(b)(1), unless the authorization is terminated  or revoked sooner.       Influenza A by PCR NEGATIVE NEGATIVE Final   Influenza B by PCR NEGATIVE NEGATIVE Final    Comment: (NOTE) The Xpert Xpress SARS-CoV-2/FLU/RSV plus assay is intended as an aid in the diagnosis of influenza from Nasopharyngeal swab specimens and should not be used as a sole basis for treatment. Nasal washings and aspirates are unacceptable for Xpert Xpress SARS-CoV-2/FLU/RSV testing.  Fact Sheet for Patients: BloggerCourse.com  Fact Sheet for Healthcare  Providers: SeriousBroker.it  This test is not yet approved or cleared by the [place] FDA and has been authorized for detection and/or diagnosis of SARS-CoV-2 by FDA under an Emergency Use Authorization (EUA). This EUA will remain in effect (meaning this test can be used) for the duration of the COVID-19 declaration under Section 564(b)(1) of the Act, 21 U.S.C. section 360bbb-3(b)(1), unless the authorization is terminated or revoked.  Performed at [facility], [address] [place], [place] [address]   Culture, blood (routine x 2)     Status: None (Preliminary result)   Collection Time: 03/18/21  8:20 PM   Specimen: Site Not Specified; Blood  Result Value Ref Range Status   Specimen Description SITE NOT SPECIFIED  Final   Special Requests   Final    BOTTLES DRAWN AEROBIC AND ANAEROBIC Blood Culture adequate volume   Culture   Final    NO GROWTH < 12 HOURS Performed at [facility], [address], [place], [place] [address]    Report Status PENDING  Incomplete  Culture, blood (routine x 2)     Status: None (Preliminary result)   Collection Time: 03/18/21  8:30 PM   Specimen: Site Not Specified; Blood  Result Value Ref Range Status   Specimen Description SITE NOT SPECIFIED  Final   Special  Requests   Final    BOTTLES DRAWN AEROBIC AND ANAEROBIC Blood Culture adequate volume   Culture   Final    NO GROWTH < 12 HOURS Performed at [facility], [address] [place], [place] [address]    Report Status PENDING  Incomplete  CSF culture w Gram Stain     Status: None (Preliminary result)   Collection Time: 03/19/21  5:00 AM   Specimen: CSF; Cerebrospinal Fluid  Result Value Ref Range Status   Specimen Description CSF  Final   Special Requests NONE  Final   Gram Stain   Final    WBC PRESENT,BOTH PMN AND MONONUCLEAR NO ORGANISMS SEEN CYTOSPIN SMEAR Performed at [facility], [address], [place], [place] [address]     Culture PENDING  Incomplete   Report Status PENDING  Incomplete  Culture, fungus without smear     Status: None (Preliminary result)   Collection Time: 03/19/21  5:00 AM   Specimen: CSF; Cerebrospinal Fluid  Result Value Ref Range Status   Specimen Description CSF  Final   Special Requests NONE  Final   Culture   Final    NO FUNGUS ISOLATED AFTER 1 DAY Performed at [facility], [address] [place], [place] [address]    Report Status PENDING  Incomplete  Anaerobic culture w Gram Stain     Status: None (Preliminary result)   Collection Time: 03/19/21  5:00 AM   Specimen: CSF; Cerebrospinal Fluid  Result Value Ref Range Status   Specimen Description CSF  Final   Special Requests NONE  Final   Gram Stain   Final    WBC PRESENT,BOTH PMN AND MONONUCLEAR NO ORGANISMS SEEN CYTOSPIN SMEAR Performed at [facility], [address], [place], [place] [address]    Culture PENDING  Incomplete   Report Status PENDING  Incomplete   Results for JOURDAN, DURBIN (MRN [id number]) as of 03/19/2021 07:56  Ref. Range 03/18/2021 23:44  Hep A Ab, IgM Latest Ref Range: NON REACTIVE  NON REACTIVE  Hepatitis B Surface Ag Latest Ref Range: NON REACTIVE  NON REACTIVE  Hep B Core Ab, IgM Latest Ref Range: NON REACTIVE  NON REACTIVE  HCV Ab Latest Ref Range: NON REACTIVE  NON REACTIVE   Results for ARLIND, KLINGERMAN (MRN [id number]) as of 03/19/2021 07:56  Ref. Range 03/18/2021 23:44  Hep A Ab, IgM Latest Ref Range: NON REACTIVE  NON REACTIVE  Hepatitis B Surface Ag Latest Ref Range: NON REACTIVE  NON REACTIVE  Hep B Core Ab, IgM Latest Ref Range: NON REACTIVE  NON REACTIVE  HCV Ab Latest Ref Range: NON REACTIVE  NON REACTIVE   Results for VERN, GUERETTE (MRN [id number]) as of 03/19/2021 07:56  Ref. Range 03/18/2021 23:44  HIV Screen 4th Generation wRfx Latest Ref Range: Non Reactive  Non Reactive   Cryptococcal antigen: Negative  Results for FILIPPO, PULS (MRN [id number]) as of 03/19/2021 07:56   Ref. Range 03/19/2021 05:00  Glucose, CSF Latest Ref Range: 40 - 70 mg/dL 70  Total  Protein, CSF Latest Ref Range: 15 - 45 mg/dL 84 (H)   Drugs of Abuse     Component Value Date/Time   LABOPIA POSITIVE (A) 03/18/2021 1545   COCAINSCRNUR NONE DETECTED 03/18/2021 1545   LABBENZ POSITIVE (A) 03/18/2021 1545   AMPHETMU NONE DETECTED 03/18/2021 1545   THCU POSITIVE (A) 03/18/2021 1545   LABBARB NONE DETECTED 03/18/2021 1545    Urinalysis  Component Value Date/Time   COLORURINE YELLOW 03/18/2021 1545   APPEARANCEUR CLEAR 03/18/2021 1545   LABSPEC 1.014 03/18/2021 1545   PHURINE 5.0 03/18/2021 1545   GLUCOSEU NEGATIVE 03/18/2021 1545   HGBUR MODERATE (A) 03/18/2021 1545   BILIRUBINUR NEGATIVE 03/18/2021 1545   KETONESUR 5 (A) 03/18/2021 1545   PROTEINUR 30 (A) 03/18/2021 1545   NITRITE NEGATIVE 03/18/2021 1545   LEUKOCYTESUR NEGATIVE 03/18/2021 1545   Lab Results  Component Value Date   CKTOTAL 5,599 (H) 03/18/2021    Ref. Range 03/18/2021 13:32 03/18/2021 14:01 03/18/2021 14:14 03/18/2021 14:18 03/18/2021 15:04 03/18/2021 15:18 03/18/2021 15:41 03/18/2021 15:45 03/18/2021 15:47 03/18/2021 18:08 03/18/2021 18:20 03/18/2021 19:44 03/18/2021 20:20 03/18/2021 20:30 03/18/2021 23:44 03/19/2021 05:00 03/19/2021 05:00  Ammonia Latest Ref Range: 9 - 35 umol/L   55 (H)                 Imaging: CT of the head: No acute intracranial abnormality is noted. Mucosal thickening is noted within the left maxillary antrum. CT of the cervical spine: Multilevel degenerative change without acute abnormality.  MRI Brain: Punctate focus of acute to subacute infarction in the left parietal periventricular white matter. No hemorrhage or mass.  EKG with conduction block  Assessment: 70 y.o. man with PMHx of CKD, hepatic steatosis, HTN, HLD and anxiety presenting for worsening confusion of sudden onset (48h PTA). No focal findings on examination. - MRI examination with a punctate acute ischemic stroke in the left periventricular  parietal white matter. - Laboratory findings: BUN, creatinine, liver function tests are all deranged severely. - DDx includes: encephalopathy secondary to toxic metabolic encephalopathy from the deranged liver and kidney functions, per ID: due to some tick bites reported, multisystem involvement, new conduction block on EKG, concern for tickborne CNS infection- on doxycycline. CNS infection is high on the differential list due to sudden onset AMS, preceding cough, and persistent headache and neck pain as well as toxic metabolic encephalopathy.  - Of note, the punctate stroke on the MRI does not explain his symptoms. - Spinal tap completed 5/6 AM, pending CSF studies. Initial CSF with normal white count, clear and colorless, elevated RBC most likely due to traumatic tap, normal glucose, with an elevated total protein of 84. Given the normal CSF WBC, infection is unlikely.  - Lyme serology ordered, pending, as well as CSF VDRL, HSV PCR, ANA, ANCA titers, and [place] Spotted Fever panel, B. burgdorferi antibodies per ID.   Impression: Acute encephalopathy toxic metabolic encephalopathy in the setting of deranged renal function and hepatic function versus CNS infection Stroke on MRI- unclear clinical significance  Recommendations:  Empiric meningitic/encephalitic coverage-have been informed by pharmacy that his renal function is too impaired for acyclovir. With improvement in renal function, consider acyclovir until HSV PCR results. Of note, MRI brain shows no evidence for herpes encephalitis.   Continue doxycycline per ID  Correction of deranged renal and liver function per primary team as you are  Follow up with pending lab values  Stroke prophylaxis: ASA 81 mg PO daily  Lipid panel pending goal LDL < 70  Reasonable to evaluate for any vasculitic process but clinical course is much too acute for this to be a vasculitic phenomenon    Lanae BoastStevi Toberman, AGACNP-BC [facility] [phone]  Electronically signed: Dr. Caryl PinaEric Rhia Blatchford

## 2021-03-19 NOTE — Consult Note (Signed)
[facility]    Date of Admission:  03/18/2021     Reason for Consult: concern for lyme related process    Referring Provider: Criselda Peaches     Lines:  Peripheral iv's  Abx: 5/05-c doxy  5/06 ceftriaxone 5/06 ampicillin 5/05 vanc        Assessment: 70 y.o. male obese, otherwise healthy admitted on 5/05 for 2-3 days progressive confusion and headache, found to have stroke, ekg with 1st degree avb, lft elevation/rhabdo  Had tick bites but onset of sx or lack of symptomatology not suggestive of lyme/rmsf. Lyme is rare in [place] but is reported to have primary infection here. Typically occurs several days after tick infection, and the early dissemination lyme carditis/cns involvement occurs weeks after infection. The rmsf typically present with headache/fever then a centrifugal rash a few days after fever onset. His rash is minute papular almost follicular rash on the chest (with 2 rash that appears to be ekg electrodes related  He does have stroke on mri in the lacunar distribution that potentially suggest htn process and ?perhaps embolic. Would benefit from neurology input regarding TEE.  His ekg change pr slightly prolonged suggesting 1st degree avb but could be related to age/tca changes. Again clinically doesn't suggest a tick related illness  So far no clear evidence of sepsis here. Csf analysis rather bland. Protein could be high in setting of stroke.   Certainly tick related process doesn't have to be lyme/rmfs, and could be toxin or viral related. He does have Rhabdo/lft elevation and aki which could be related. Autoimmune process a consideration and being screened by primary team  5/06 today he is slightly better but still confused per his wife  Plan: 1. Given low suspicion for tick related lyme or the like, reasonable to stop ceftriaxone. Can continue doxycycline for now pending serology (ultimately a short empiric course is likely called for  given how long serology returns) 2. Would also stop cns bacterial coverage as well given csf finding and mri finding 3. Would consider tee 4. F/u autoimmune serology and tick serology 5. Repeat ekg 6. Discussed with primary team   I spent 60 minute reviewing data/chart, and coordinating care and >50% direct face to face time providing counseling/discussing diagnostics/treatment plan with patient       ------------------------------------------------ Active Problems:   Altered mental status    HPI: Noah Pacheco is a 70 y.o. male obese, otherwise healthy admitted on 5/05 for 2-3 days progressive confusion and headache, found to have stroke, ekg with 1st degree avb, lft elevation/rhabdo  Hx via patient and his wife who is at bedside  [place] [address] Reported doing well until 2-3 days ago when patient starts developing confusion. Still able to get around on his own. Wife works in the [employer] development [employer]. Patient stays home. 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   Family noted tick bites within 24 hours of sx onset  Question of headache; patient doesn't endorsed but wife reports. No n/v/diarrhea, myalgia, joint swelling, cough, chest pain, sob, numbness/tingling, dyspnea  Rash? Reported per primary team here but wife/patient not awared. No erythema migran noted  He has chronic feet neuropathy and recently a few days prior to admission started taking amitriptyline as well  No known prior cad  Hospital course: Afebrile; no leukocytosis Wbc normal; cr 2.7 (unclear baseline); ck 9500 --> 5600; lft 193/95/80/1.4; no thrombocytopenia Started on bsAbx including doxy  Mri brain punctate stroke Liver u/s steatosis cxr clear Tick serology sent for lyme/rmsf Autoimmune serology also sent  Rpr/hiv/hepatitis screen negative  LP done today 5/06; 1-2 wbc, slightly elevated protein  No sick contact No recent travel  fam hx: No autoimmune disease No blood  clot Mother with early stroke in her 65s  Social History   Substance Use Topics  . Alcohol use: Not Currently  . Drug use: Not Currently    No Known Allergies  Review of Systems: ROS All Other ROS was negative, except mentioned above   Past Medical History:  Diagnosis Date  . Anemia   . Anxiety   . CKD (chronic kidney disease)   . Gout   . Hepatic steatosis   . Hyperlipidemia   . Hypertension   . Prediabetes        Scheduled Meds: . amLODipine  5 mg Oral Daily  . doxycycline  100 mg Oral Q12H  . heparin  5,000 Units Subcutaneous Q8H  . lidocaine (PF)  5 mL Infiltration Once  . lidocaine (PF)      . sodium chloride flush  3 mL Intravenous Q12H   Continuous Infusions: . [START ON 03/20/2021] cefTRIAXone (ROCEPHIN)  IV    . lactated ringers 150 mL/hr at 03/19/21 1207  . promethazine (PHENERGAN) injection (IM or IVPB)     PRN Meds:.ALPRAZolam   OBJECTIVE: Blood pressure (!) 155/73, pulse 71, temperature 98.5 F (36.9 C), temperature source Oral, resp. rate 16, height 5\' 10"  (1.778 m), weight 103 kg, SpO2 96 %.  Physical Exam Constitutional:      General: He is not in acute distress.    Appearance: Normal appearance. He is obese. He is not ill-appearing.  HENT:     Head: Normocephalic and atraumatic.     Mouth/Throat:     Mouth: Mucous membranes are moist.  Eyes:     Conjunctiva/sclera: Conjunctivae normal.     Pupils: Pupils are equal, round, and reactive to light.  Cardiovascular:     Rate and Rhythm: Normal rate and regular rhythm.     Pulses: Normal pulses.     Heart sounds: Normal heart sounds.  Pulmonary:     Effort: Pulmonary effort is normal.     Breath sounds: Normal breath sounds.  Abdominal:     Palpations: Abdomen is soft.     Tenderness: There is no abdominal tenderness.  Musculoskeletal:        General: No swelling or tenderness. Normal range of motion.     Cervical back: Normal range of motion.  Skin:    General: Skin is warm.      Comments: Punctate/follicular erythematous rash on chest; 2 annular rash the size of ekg electrodes left chest  Neurological:     Mental Status: He is alert. He is disoriented.     Cranial Nerves: No cranial nerve deficit.     Sensory: No sensory deficit.     Motor: No weakness.     Coordination: Coordination normal.  Psychiatric:        Mood and Affect: Mood normal.        Behavior: Behavior normal.        Thought Content: Thought content normal.       Lab Results Lab Results  Component Value Date   WBC 7.0 03/18/2021   HGB 11.9 (L) 03/18/2021   HCT 35.8 (L) 03/18/2021   MCV 99.4 03/18/2021   PLT 187 03/18/2021    Lab Results  Component Value Date  CREATININE 2.73 (H) 03/18/2021   BUN 70 (H) 03/18/2021   NA 135 03/18/2021   K 3.8 03/18/2021   CL 102 03/18/2021   CO2 23 03/18/2021    Lab Results  Component Value Date   ALT 95 (H) 03/18/2021   AST 193 (H) 03/18/2021   GGT 103 (H) 03/18/2021   ALKPHOS 80 03/18/2021   BILITOT 1.4 (H) 03/18/2021      Microbiology: Recent Results (from the past 240 hour(s))  Resp Panel by RT-PCR (Flu A&B, Covid) Nasopharyngeal Swab     Status: None   Collection Time: 03/18/21  3:04 PM   Specimen: Nasopharyngeal Swab; Nasopharyngeal(NP) swabs in vial transport medium  Result Value Ref Range Status   SARS Coronavirus 2 by RT PCR NEGATIVE NEGATIVE Final    Comment: (NOTE) SARS-CoV-2 target nucleic acids are NOT DETECTED.  The SARS-CoV-2 RNA is generally detectable in upper respiratory specimens during the acute phase of infection. The lowest concentration of SARS-CoV-2 viral copies this assay can detect is 138 copies/mL. A negative result does not preclude SARS-Cov-2 infection and should not be used as the sole basis for treatment or other patient management decisions. A negative result may occur with  improper specimen collection/handling, submission of specimen other than nasopharyngeal swab, presence of viral mutation(s)  within the areas targeted by this assay, and inadequate number of viral copies(<138 copies/mL). A negative result must be combined with clinical observations, patient history, and epidemiological information. The expected result is Negative.  Fact Sheet for Patients:  BloggerCourse.com  Fact Sheet for Healthcare Providers:  SeriousBroker.it  This test is no t yet approved or cleared by the [place] FDA and  has been authorized for detection and/or diagnosis of SARS-CoV-2 by FDA under an Emergency Use Authorization (EUA). This EUA will remain  in effect (meaning this test can be used) for the duration of the COVID-19 declaration under Section 564(b)(1) of the Act, 21 U.S.C.section 360bbb-3(b)(1), unless the authorization is terminated  or revoked sooner.       Influenza A by PCR NEGATIVE NEGATIVE Final   Influenza B by PCR NEGATIVE NEGATIVE Final    Comment: (NOTE) The Xpert Xpress SARS-CoV-2/FLU/RSV plus assay is intended as an aid in the diagnosis of influenza from Nasopharyngeal swab specimens and should not be used as a sole basis for treatment. Nasal washings and aspirates are unacceptable for Xpert Xpress SARS-CoV-2/FLU/RSV testing.  Fact Sheet for Patients: BloggerCourse.com  Fact Sheet for Healthcare Providers: SeriousBroker.it  This test is not yet approved or cleared by the [place] FDA and has been authorized for detection and/or diagnosis of SARS-CoV-2 by FDA under an Emergency Use Authorization (EUA). This EUA will remain in effect (meaning this test can be used) for the duration of the COVID-19 declaration under Section 564(b)(1) of the Act, 21 U.S.C. section 360bbb-3(b)(1), unless the authorization is terminated or revoked.  Performed at [facility], [address] [place], [place] [address]   Culture, blood (routine x 2)     Status: None  (Preliminary result)   Collection Time: 03/18/21  8:20 PM   Specimen: Site Not Specified; Blood  Result Value Ref Range Status   Specimen Description SITE NOT SPECIFIED  Final   Special Requests   Final    BOTTLES DRAWN AEROBIC AND ANAEROBIC Blood Culture adequate volume   Culture   Final    NO GROWTH < 12 HOURS Performed at [facility], [address], [place], [place] [address]  Report Status PENDING  Incomplete  Culture, blood (routine x 2)     Status: None (Preliminary result)   Collection Time: 03/18/21  8:30 PM   Specimen: Site Not Specified; Blood  Result Value Ref Range Status   Specimen Description SITE NOT SPECIFIED  Final   Special Requests   Final    BOTTLES DRAWN AEROBIC AND ANAEROBIC Blood Culture adequate volume   Culture   Final    NO GROWTH < 12 HOURS Performed at [facility], [address] [place], [place] [address]    Report Status PENDING  Incomplete  CSF culture w Gram Stain     Status: None (Preliminary result)   Collection Time: 03/19/21  5:00 AM   Specimen: CSF; Cerebrospinal Fluid  Result Value Ref Range Status   Specimen Description CSF  Final   Special Requests NONE  Final   Gram Stain   Final    WBC PRESENT,BOTH PMN AND MONONUCLEAR NO ORGANISMS SEEN CYTOSPIN SMEAR Performed at [facility], [address] [place], [place] [address]    Culture PENDING  Incomplete   Report Status PENDING  Incomplete  Culture, fungus without smear     Status: None (Preliminary result)   Collection Time: 03/19/21  5:00 AM   Specimen: CSF; Cerebrospinal Fluid  Result Value Ref Range Status   Specimen Description CSF  Final   Special Requests NONE  Final   Culture   Final    NO FUNGUS ISOLATED AFTER 1 DAY Performed at [facility], [address] [place], [place] [address]    Report Status PENDING  Incomplete  Anaerobic culture w Gram Stain     Status: None (Preliminary result)   Collection Time: 03/19/21  5:00 AM   Specimen:  CSF; Cerebrospinal Fluid  Result Value Ref Range Status   Specimen Description CSF  Final   Special Requests NONE  Final   Gram Stain   Final    WBC PRESENT,BOTH PMN AND MONONUCLEAR NO ORGANISMS SEEN CYTOSPIN SMEAR Performed at [facility], [address] [place], [place] [address]    Culture PENDING  Incomplete   Report Status PENDING  Incomplete    5/05 uds -- marijuanna, opiate/benzo detected  Serology: 5/05 rpr negative; hepatitis acute panel negative; serum crAg negative; hiv screen negative 5/05 flu/covid negative  Micro: 5/06 csf cx in progress; fungal cx in progress; 70 glucose, 84 protein, 1 wbc; colorless 5/05 bcx in progress   Imaging: If present, new imagings (plain films, ct scans, and mri) have been personally visualized and interpreted; radiology reports have been reviewed. Decision making incorporated into the Impression / Recommendations.   5/05 brain mri Punctate focus of acute to subacute infarction in the left parietal periventricular white matter. No hemorrhage or mass.  5/05 liver u/s 1. Diffusely echogenic liver consistent with hepatic steatosis and or hepatocellular disease. 2. Negative for gallstones  5/05 u/s renal No acute abnormality; normal kidney size  5/05 cxr clear    Raymondo Bandrung T Rainee Sweatt, MD [facility] [phone] pager    03/19/2021, 1:43 PM

## 2021-03-19 NOTE — Progress Notes (Signed)
  Date: 03/19/2021  Patient name: Noah Pacheco  Medical record number: [id number]  Date of birth: 1951-09-15   I have seen and evaluated Noah Pacheco and discussed their care with the Residency Team. Briefly, Mr. Noah Pacheco is a 27 year man with PMH of CKD and fatty liver disease who presented with confusion, fall, disorientation and erratic behavior.  He was found to have acute on chronic renal failure and there was concern for encephalitis.  He had a history of tick bites and recently had many ticks on him.  He had a fall on concrete and may have hit his head.  He underwent a lumbar puncture this morning.  ID and neurology were consulted.   PMHx, Fam Hx, and/or Soc Hx : He has a FH of stroke, dementia, breast cancer.   Vitals:   03/19/21 0623 03/19/21 1235  BP: 136/65 (!) 155/73  Pulse: 69 71  Resp: 16 16  Temp: 98.3 F (36.8 C) 98.5 F (36.9 C)  SpO2: 98% 96%   Gen: More alert than previous, no distress Eyes: + injection, no icterus, normal pupillary response to light and accomodation  HENT: Neck is supple, Dry MM CV: RR, NR, no murmur noted, pulses decreased in DP bilaterally but palpable Pulm: Breathing easily on room air, no wheezing Abd: Soft, +BS, ND MSK: Normal bulk and tone, no contractures Skin: red papular rash on chest, no rash on extremities or feet Psych: Somewhat pressured speech, less confused than previous  EKG: Sinus, PVC, incomplete RBBB vs. Conduction delay  MRI brain: Punctate subacute stroke left parietal lobe  Assessment and Plan: I have seen and evaluated the patient as outlined above. I agree with the formulated Assessment and Plan as detailed in the residents' note, with the following changes:   Acute encephalopathy, toxic/metabolic vs. 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    Noah Catalina, MD 5/6/20226:04 PM

## 2021-03-19 NOTE — Procedures (Addendum)
LUMBAR PUNCTURE (SPINAL TAP) PROCEDURE NOTE  Indication: Encephalitis   Proceduralists: Dr Milon Dikes Bedside RN Matos assisting Observer - Dr. Cleaster Corin  Risks of the procedure were dicussed with the patient including post-LP headache, bleeding, infection, weakness/numbness of legs(radiculopathy), death.    Consent obtained from: patient with RN witnessing   Procedure Note The patient was prepped and draped, and using sterile technique a 20 gauge quinke spinal needle was inserted in the L4-5 space.   Opening pressure was not measures - LP performed in sitting position.  Approximately 18 cc of CSF were obtained and sent for analysis.  Patient tolerated the procedure well and blood loss was minimal.  Sample walked to the lab.  Can start sq heparin in 6h  Plan d/w Dr. Cleaster Corin from primary team  -- Milon Dikes, MD Neurologist [facility] Pager: [phone]

## 2021-03-19 NOTE — Progress Notes (Deleted)
Pharmacy Antibiotic Note  Noah Pacheco is a 70 y.o. male admitted on 03/18/2021 presenting with AMS, concern for meningitis.  Pharmacy has been consulted for vancomycin dosing.  Scr improved this morning to 2.73  Plan: Begin Vancomycin 1250 iv Q 24 hours tonight Ampicillin, Ceftriaxone, and doxycycline continue Meningitis workup underway - > acyclovir now?  Height: 5\' 10"  (177.8 cm) Weight: 103 kg (227 lb) IBW/kg (Calculated) : 73  Temp (24hrs), Avg:98.3 F (36.8 C), Min:98.2 F (36.8 C), Max:98.4 F (36.9 C)  Recent Labs  Lab 03/18/21 1332 03/18/21 1547 03/18/21 2344  WBC 8.8  --  7.0  CREATININE 6.52*  --  2.73*  LATICACIDVEN  --  1.5  --     Estimated Creatinine Clearance: 30.3 mL/min (A) (by C-G formula based on SCr of 2.73 mg/dL (H)).    No Known Allergies  Thank you 05/18/21, PharmD 03/19/2021 8:33 AM

## 2021-03-19 NOTE — Progress Notes (Addendum)
Patient ID: Noah Pacheco, male   DOB: December 10, 1950, 70 y.o.   MRN: [id number]   Subjective:  ON: early this Am nurse found patient walking around room stating he had a bad headache; neurology was contacted and stated patient likely had CSF leak secondary to LP this morning and instructed nurse to lie patient flat in bed for 4 hr post procedure and to give dose of phenergan.   Patient reports dong well this morning except a bad headache. He reports that the headache is the same as the headache he has had for the past few days. He reports no changes in the headache. Though he also states that his headache is from his LP this morning.  Wife sates that his mentation is improved from yesterday, but still not back to baseline.    Objective:  Vital signs in last 24 hours: Vitals:   03/18/21 2037 03/18/21 2217 03/18/21 2258 03/19/21 0623  BP: (!) 105/52 138/64 (!) 143/80 136/65  Pulse: 70 71 73 69  Resp: 15 16 18 16   Temp: 98.4 F (36.9 C) 98.3 F (36.8 C) 98.4 F (36.9 C) 98.3 F (36.8 C)  TempSrc: Oral Oral    SpO2: 97% 97% 94% 98%   Weight change:   Intake/Output Summary (Last 24 hours) at 03/19/2021 0827 Last data filed at 03/19/2021 0754 Gross per 24 hour  Intake --  Output 1001 ml  Net -1001 ml   Physical Exam Cardiovascular:     Rate and Rhythm: Normal rate and regular rhythm.     Pulses: Normal pulses.     Heart sounds: Normal heart sounds.  Pulmonary:     Effort: Pulmonary effort is normal.     Breath sounds: Normal breath sounds.  Abdominal:     General: Abdomen is flat. Bowel sounds are normal. There is no distension.     Palpations: Abdomen is soft.     Tenderness: There is no abdominal tenderness.  Musculoskeletal:     Right lower leg: No edema.     Left lower leg: No edema.     Comments: No swelling, erythema, or tenderness surrounding LP site  Feet:     Right foot:     Skin integrity: Skin integrity normal.     Left foot:     Skin integrity: Skin integrity  normal.  Skin:    General: Skin is warm and dry.     Findings: Rash present.     Comments: Scattered erythematous papules with central ulceration across chest.   Neurological:     General: No focal deficit present.     Mental Status: He is alert. He is disoriented.  Psychiatric:        Speech: Speech is rapid and pressured.     CBC    Component Value Date/Time   WBC 7.0 03/18/2021 2344   RBC 3.60 (L) 03/18/2021 2344   HGB 11.9 (L) 03/18/2021 2344   HCT 35.8 (L) 03/18/2021 2344   PLT 187 03/18/2021 2344   MCV 99.4 03/18/2021 2344   MCH 33.1 03/18/2021 2344   MCHC 33.2 03/18/2021 2344   RDW 12.3 03/18/2021 2344   LYMPHSABS 1.0 03/18/2021 1332   MONOABS 0.6 03/18/2021 1332   EOSABS 0.3 03/18/2021 1332   BASOSABS 0.0 03/18/2021 1332   CMP Latest Ref Rng & Units 03/18/2021 03/18/2021 03/18/2021  Glucose 70 - 99 mg/dL 99 - 95  BUN 8 - 23 mg/dL 05/18/2021) - 09(T)  Creatinine 0.61 - 1.24 mg/dL 26(Z) - 1.24(P)  Sodium 135 - 145 mmol/L 135 - 139  Potassium 3.5 - 5.1 mmol/L 3.8 - 4.6  Chloride 98 - 111 mmol/L 102 - 102  CO2 22 - 32 mmol/L 23 - 21(L)  Calcium 8.9 - 10.3 mg/dL 6.6(A) - 8.9  Total Protein 6.5 - 8.1 g/dL 6.5 - 7.4  Total Bilirubin 0.3 - 1.2 mg/dL 6.3(K) 1.6(W) 1.3(H)  Alkaline Phos 38 - 126 U/L 80 - 89  AST 15 - 41 U/L 193(H) - 261(H)  ALT 0 - 44 U/L 95(H) - 108(H)   Urinalysis    Component Value Date/Time   COLORURINE YELLOW 03/18/2021 1545   APPEARANCEUR CLEAR 03/18/2021 1545   LABSPEC 1.014 03/18/2021 1545   PHURINE 5.0 03/18/2021 1545   GLUCOSEU NEGATIVE 03/18/2021 1545   HGBUR MODERATE (A) 03/18/2021 1545   BILIRUBINUR NEGATIVE 03/18/2021 1545   KETONESUR 5 (A) 03/18/2021 1545   PROTEINUR 30 (A) 03/18/2021 1545   NITRITE NEGATIVE 03/18/2021 1545   LEUKOCYTESUR NEGATIVE 03/18/2021 1545    Ref Range & Units 1 d ago  GGT 7 - 50 U/L 103High    Component Ref Range & Units 1 d ago  (03/18/21) 1 d ago  (03/18/21)  Total Bilirubin 0.3 - 1.2 mg/dL 1.0XNAT   5.5DDUK   Bilirubin, Direct 0.0 - 0.2 mg/dL 0.2RKYH    Indirect Bilirubin 0.3 - 0.9 mg/dL 0.6CBJS     Vitamin E-83 180 - 914 pg/mL 719    Folate >5.9 ng/mL 45.6    Prothrombin Time 11.4 - 15.2 seconds 14.2   INR 0.8 - 1.2 1.1    aPTT 24 - 36 seconds 30    Component Ref Range & Units 1 d ago  (03/18/21) 1 d ago  (03/18/21)  Total CK 49 - 397 U/L 5,599High  9,562High CM     0 Result Notes  Component Ref Range & Units 05:00 05:00  Tube #  4  1   Color, CSF COLORLESS COLORLESS  COLORLESS   Appearance, CSF CLEAR CLEARAbnormal  CLEARAbnormal   Supernatant  NOT INDICATED  NOT INDICATED   RBC Count, CSF 0 /cu mm 455High  378High   WBC, CSF 0 - 5 /cu mm 2  1   Other Cells, CSF  TOO FEW TO COUNT, SMEAR AVAILABLE FOR REVIEW  TOO FEW TO COUNT, SMEAR AVAILABLE FOR REVIEW CM   Comment: RARE LYMPHS, MONOS, AND SEGS       Ref Range & Units 05:00  Glucose, CSF 40 - 70 mg/dL 70   Total Protein, CSF 15 - 45 mg/dL 15VVOH    MRI brain w/o contrast IMPRESSION: Punctate focus of acute to subacute infarction in the left parietal periventricular white matter. No hemorrhage or mass.  Assessment/Plan:  Active Problems:   Altered mental status  Noah Pacheco is a 70 year old male with a history of CKD IIIa, hepatic steatosis, normocytic anemia, chronic neck pain secondary to degenerative disc disease, and anxiety who presented to the ED from PCP office due to 2.5 days of increasingly AMS and headaches admitted for work up of acute renal failure, AMS, and transaminitis.   AMS - toxic metabolic encephalopathy v CNS infection (possibly tick borne illness) Stroke identified on MRI no big enough to explain current symptoms.  Considering vasculitides - however acute onset makes this less likely Neurosyphilis -  Not likely with negative RPR.  AMS most likely secondary to metabolic derangements. Specifically, uremia from acute kidney failure secondary to rhabdomyolysis also high on  the differential for  possible mental status changes given that with fluid resuscitation both creatinine and AMS significantly improved.  But given tick exposure, headaches, photophobia, and multisystem involvement CNS infection from tick borne illness is high on differential at this time. However, no fever or thrombocytopenia ever present making Lyme/RMSF less likely.  CSF testing ruled out a bacterial or fungal infection with high-normal glucose and increased protein. CSF/serum protein ratio 0.71 which is consistent with a viral infection. Also possible to a parasitic infection or autoimmune process.  Given tick exposure, would consider babesiosis. However, smear review showed normal morphology (pathology review pending). Patient also seems to be experiencing hyperactive delirium which is less consistent with babesiosis CNS infection than hypoactive delirium. Additionally he has also been afebrile making this less likely.    Considering significant outdoor exposure and CSF studies consistent with viral process considering [place] virus. However, no evidence of characteristic flaccid paralysis or other focal neurologic findings and it is also not mosquito season. Additionally, patient reports minimal mosquito activity near and around his house.  If all other work up is negative would also consider a porphyria given that the patient reports neurologic and psychiatric symptoms and presentation with rhabdomyolysis, anemia, and acute kidney failure in the setting of poor food and water intake. However, patient has no personal or family history of similar episodes, urine appear yellow rather than characteristic tea color, rash is not consistent with porphyria, and patient denies any abdominal pain or nausea.   - ID consulted recommendations:  Continue doxycycline due to concern for tick borne illness  D/C ceftriaxone  Discontinue ampicillin and vancomycin  LP - completed this morning -Neurology  consulted recommendations:  Empiric meningitic/encephalitic coverage - will hold off on acyclovir due to severe kidney dysfunction  LP with IR - completed this morning  MRI brain with contrast once renal function improves - CSF studies  CSF culture with Gram stain (aerobic and anaerobic)   anaerobic - no organisms seen PMN and monocytes present; culture pending   Fungal - no growth at 1 day   Aerobic -  no organisms seen PMN and monocytes present; culture pending   VDRL - pending   HSV 1/2 PCR - pending  Cryptococcal antigen - negative  Cell count with differential   Increased RBC   2 WBC; too few other cells available to review - Vaculitidies work up pending  ANCA titers  ANA  - other infectious work up pending  Blood cultures - no growth at < 24 hours  Pathology smear review   RMSF antibodies  Lyme antibodies   Renal failure - likely secondary to rhabdomyolysis  Baseline creatinine is 1.19. Found to have creatinine of 6.52 in ED yesterday. Down trending today to 2.73. Given significant rhabdomyolysis and rapid improvement in creatinine and BUN with fluid administration acute renal failure likely secondary to rhabdomyolysis. Patient denies increased physcial activity over the prior week, though he has had decreased oral intake of food and water. It is possible that his rhabdomyolysis is secondary to slow dehydration in combination with his baseline CKD. However, he did not appear dry on initial exam in ED.  - nephrology consult - trend CMP   Rhabdomyolysis  CK 9562 on admission given 3 L bolus in total and started on 150 cc/hr LR. CK this AM 5599. Documented urine output is 1 mL overnight, however upon discussion with nurse this is inaccurate and he has put out significant urine volume overnight. Additionally, in the ED yesterday it was noted that  he had a bedside urinal that was full of approximately 300-500 mL of urine.  Etiology of rhabdomyolysis potentially  secondary to decreased oral intake in combination with opoid use v slow dehydration in combination with baseline CKD.  - trend CK - Continue LR 150 mL/hr - monitor in/out   Prior stroke - identified on MRI inadvertently  Subacute stroke identified on MRI showed punctate focus of acute to subacute infarction in the left parietal periventricular white matter. unliekly to be related to current presentation.  - consider TEE - consider carotid artery US bilateral  Acute liver injury AST, ALT, and bilirubin are down trending. GGT increased likely secondary to known fatty liver disease, though could indicate primary hepatobiliary process in setting of acutely increased LFTs.  Normal INR and albumin indicates normal liver synthetic function. Etiology of acute liver injury believed to be secondary to rhabdomyolysis.  - negative hepatitis panel  - monitor CMP  Conduction block  No prior to compare to. Could be chronic v related to acute illness. In setting of concern for tick borne illness increased concern for Lyme carditis. However, more likely due to rhabdo v amitriptyline given no fever or thrombocytopenia. No reports of abnormal cardiac activity on tele.  - continue cardiac monitoring - lyme serologies pending -RMSF serologies pending  Anemia Macrocytosis resolved on CBC this AM, though still high normal. Folate and B12 normal. Retic counts normal. Appears to be chronic, potentially unrelated to acute illness.  -  Trend CBC - pathology blood smear review pending   HTN Patient hypertensive today. IN setting of AKI holding home BP meds - start amlodipine 5 mg  Diet: heart healthy  VTE ppx: heparin  IVF: 150 cc/hr LR  Dispo: pending further work up      LOS: 1 day   Laverna PeaceWilliams, Monai Hindes, Medical Student 03/19/2021, 8:27 AM

## 2021-03-19 NOTE — Progress Notes (Signed)
Upon entering room, patient ambulating and stated that he could walk 1 hr after procedure. He was also complaining of a "bad headache". I notified Dr. Georg Ruddle (neuro). He stated the patient probably had a CSF leak and he needed to be layin flat for 4 hrs post procedure. I Notified patient, put him flat in bed and set bed alarm. Will continue to monitor and if no improvemnet, MD ordered PRN dose of phenergan

## 2021-03-20 DIAGNOSIS — R7989 Other specified abnormal findings of blood chemistry: Secondary | ICD-10-CM

## 2021-03-20 DIAGNOSIS — I959 Hypotension, unspecified: Secondary | ICD-10-CM

## 2021-03-20 DIAGNOSIS — S90561D Insect bite (nonvenomous), right ankle, subsequent encounter: Secondary | ICD-10-CM

## 2021-03-20 DIAGNOSIS — R7401 Elevation of levels of liver transaminase levels: Secondary | ICD-10-CM

## 2021-03-20 DIAGNOSIS — M6282 Rhabdomyolysis: Secondary | ICD-10-CM

## 2021-03-20 DIAGNOSIS — N179 Acute kidney failure, unspecified: Secondary | ICD-10-CM

## 2021-03-20 DIAGNOSIS — W57XXXD Bitten or stung by nonvenomous insect and other nonvenomous arthropods, subsequent encounter: Secondary | ICD-10-CM

## 2021-03-20 DIAGNOSIS — D649 Anemia, unspecified: Secondary | ICD-10-CM

## 2021-03-20 LAB — COMPREHENSIVE METABOLIC PANEL
ALT: 90 U/L — ABNORMAL HIGH (ref 0–44)
AST: 123 U/L — ABNORMAL HIGH (ref 15–41)
Albumin: 3.3 g/dL — ABNORMAL LOW (ref 3.5–5.0)
Alkaline Phosphatase: 121 U/L (ref 38–126)
Anion gap: 6 (ref 5–15)
BUN: 28 mg/dL — ABNORMAL HIGH (ref 8–23)
CO2: 28 mmol/L (ref 22–32)
Calcium: 9.1 mg/dL (ref 8.9–10.3)
Chloride: 106 mmol/L (ref 98–111)
Creatinine, Ser: 0.93 mg/dL (ref 0.61–1.24)
GFR, Estimated: >60 mL/min (ref >60)
Glucose, Bld: 106 mg/dL — ABNORMAL HIGH (ref 70–99)
Potassium: 4.2 mmol/L (ref 3.5–5.1)
Sodium: 140 mmol/L (ref 135–145)
Total Bilirubin: 1 mg/dL (ref 0.3–1.2)
Total Protein: 6.1 g/dL — ABNORMAL LOW (ref 6.5–8.1)

## 2021-03-20 LAB — CBC
HCT: 35.7 % — ABNORMAL LOW (ref 39.0–52.0)
Hemoglobin: 11.9 g/dL — ABNORMAL LOW (ref 13.0–17.0)
MCH: 32.9 pg (ref 26.0–34.0)
MCHC: 33.3 g/dL (ref 30.0–36.0)
MCV: 98.6 fL (ref 80.0–100.0)
Platelets: 204 10*3/uL (ref 150–400)
RBC: 3.62 MIL/uL — ABNORMAL LOW (ref 4.22–5.81)
RDW: 12 % (ref 11.5–15.5)
WBC: 6.6 10*3/uL (ref 4.0–10.5)
nRBC: 0 % (ref 0.0–0.2)

## 2021-03-20 LAB — GLUCOSE, CAPILLARY: Glucose-Capillary: 120 mg/dL — ABNORMAL HIGH (ref 70–99)

## 2021-03-20 LAB — ANA: Anti Nuclear Antibody (ANA): NEGATIVE

## 2021-03-20 LAB — CK: Total CK: 1691 U/L — ABNORMAL HIGH (ref 49–397)

## 2021-03-20 MED ORDER — DOXYCYCLINE HYCLATE 100 MG PO TABS: 100.0000 mg | ORAL_TABLET | Freq: Two times a day (BID) | ORAL | 0 refills | Status: DC

## 2021-03-20 MED ORDER — ENALAPRIL MALEATE 20 MG PO TABS
20.0000 mg | ORAL_TABLET | Freq: Every day | ORAL | Status: DC
  Filled 2021-03-20 (×2): qty 1

## 2021-03-20 MED ORDER — ASPIRIN EC 81 MG PO TBEC: 81.0000 mg | DELAYED_RELEASE_TABLET | Freq: Every day | ORAL | 11 refills | Status: AC

## 2021-03-20 MED ORDER — HYDROCHLOROTHIAZIDE 12.5 MG PO CAPS
12.5000 mg | ORAL_CAPSULE | Freq: Every day | ORAL | Status: DC
  Administered 2021-03-20: 12.5 mg via ORAL
  Filled 2021-03-20: qty 1

## 2021-03-20 NOTE — Progress Notes (Signed)
     Subjective: Noah Pacheco reports that he is feeling back to normal and is requesting to go home.  His confusion is resolved.  He has no sequelae of stroke, renal function normal.  He did have 2 loose bowel movements overnight with crampy abdominal pain and is being evaluated for Cdiff given the amount of Abx he has been on.    Objective:  Vital signs in last 24 hours: Vitals:   03/19/21 2050 03/20/21 0038 03/20/21 0227 03/20/21 0411  BP: (!) 174/82 (!) 165/80  (!) 163/85  Pulse: 79 60  72  Resp: 17 19  20   Temp: 98.2 F (36.8 C) 98 F (36.7 C)  97.8 F (36.6 C)  TempSrc: Oral Oral  Oral  SpO2: 97% 99%  95%  Weight:   103 kg   Height:       Gen: Awake, alert, jovial HENT: Neck is supple, moist MM CV: RR, NR, no murmur, no pedal edema Pulm: Breathing comfortably, no wheezing Abd: Soft, NT, ND, +BS MSK: Normal tone and bulk Psych: Talkative, jovial, alert, oriented X 4  Assessment/Plan:  Acute encephalopathy, toxic vs. Infectious - Mental status improved to baseline, alert and oriented, confirmed with spouse at bedside - Bacterial meningitis ruled out, most Abx stopped yesterday.  He is on doxycycline and it is reasonable to be on for 2 weeks course - No evidence for herpes encephalitis on MRI - renal function is normalized, but have not started acyclovir at this time, patient is much improved - BUN is resolving, MS changes could be related to acute uremia, though exact cause of renal failure is unclear - Continue to monitor today, discuss with consultants if possible to return home and call patient with any abnormal results - Hold amitriptyline at discharge    Conduction delay on EKG - Seen on EKG X 2, would recommend outpatient follow up and repeat of EKG    Acute renal failure on CKD stage 3a Transaminitis Mild Rhabdomyolysis - Resolving - CK down to < 5000 - Cr at baseline - BUN remains 28 - LFTs downtrending, likely related to rhabdomyolysis - Hold fluids,  restart antihypertensives.   Punctate stroke, subacute, parietal lobe - seen on MRI - Daily Aspirin at discharge for secondary prevention - Discussed with patient and family today - LDL is 80, continue statin  Normocytic Anemia - Outpatient work up recommended  Hypertension   - BP improved and elevated today - Renal function improved - Restart home losartan and HCTZ  Dispo: Anticipated discharge in approximately 0-1 day(s).   41, MD 03/20/2021, 7:39 AM

## 2021-03-20 NOTE — Discharge Summary (Signed)
[facility] Attending Discharge note.   Name: Noah Pacheco MRN: [id number] DOB: Mar 22, 1951 70 y.o. PCP: Gwenlyn Found, MD  Date of Admission: 03/18/2021 12:52 PM Date of Discharge: 03/20/2021 Attending Physician: [name], MD  Discharge Diagnosis: 1. Encephalopathy, toxic vs. Infectious 2. AKI on CKD, uremia 3. Conduction delay on EKG 4. 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 Chronic Hypertension 6. Loose stools related to antibiotics   Discharge Medications: Allergies as of 03/20/2021   No Known Allergies     Medication List    STOP taking these medications   amitriptyline 10 MG tablet Commonly known as: ELAVIL     TAKE these medications   allopurinol 100 MG tablet Commonly known as: ZYLOPRIM Take 100 mg by mouth daily.   ALPRAZolam 0.5 MG tablet Commonly known as: XANAX Take 0.5 mg by mouth at bedtime as needed for anxiety or sleep.   ampicillin 500 MG capsule Commonly known as: PRINCIPEN Take 500 mg by mouth in the morning and at bedtime.   aspirin EC 81 MG tablet Take 1 tablet (81 mg total) by mouth daily. Swallow whole.   atorvastatin 80 MG tablet Commonly known as: LIPITOR Take 80 mg by mouth daily.   cyclobenzaprine 10 MG tablet Commonly known as: FLEXERIL Take 10 mg by mouth 3 (three) times daily as needed for muscle spasms.   doxycycline 100 MG tablet Commonly known as: VIBRA-TABS Take 1 tablet (100 mg total) by mouth every 12 (twelve) hours. For 12 days (total 14 days)   enalapril 20 MG tablet Commonly known as: VASOTEC Take 20 mg by mouth daily.   hydrochlorothiazide 12.5 MG tablet Commonly known as: HYDRODIURIL Take 12.5 mg by mouth daily.   multivitamin with minerals Tabs tablet Take 1 tablet by mouth daily.       Disposition and follow-up:   [name] was discharged from [facility] in Good condition.  At the hospital follow up visit please address:  1.  Acute encephalopathy: Monitor mental status and  evaluate if confusion returns.  He will be on doxycycline for tick borne illness treatment 2. Stroke  -unknown chronicity/subacute: Started on a daily aspirin, monitor for bleeding or other issues 3. AKI on CKD: Check BMET to ensure continued resolution 4. Loose stools related to antibiotics - evaluate if ongoing  5.  Labs / imaging needed at time of follow-up: BMET  6.  Pending labs/ test needing follow-up: PCR for lyme, HSV PCR, vasculitic work up  Follow-up Appointments:   Hospital Course by problem list: 1. Encephalopathy, toxic vs. Infectious:  Patient presented with altered mental status.  He was found to have elevated BUN and Cr.  He had recently started taking amitriptyline prior to presentation.  He was also noted to have multiple tick bites.  There was concern for infectious etiology vs. Toxic vs. Metabolic.  He was admitted and started on broad spectrum antibiotics.  He had an infectious work up including a lumbar puncture.  LP showed only 2 white blood cells, making bacterial meningitis less likely.  He had a significant work up including an ETOH level of < 10; NH4 of 55, UA with moderate hemoglobin, 0-5 RBC; UDS with opiates, BZD and THC; lactic acid 1.5, LDH of 407; total bilirubin of 1.3, TSH of 1.28, RPR nonreactive, hepatitis B negative; inr of 1.1; HIV nonreactive, A1C 5.8; Cryptococcus negative.  He was back to baseline after fluids and treatment with doxycyline.  He was initially started on very broad spectrum  antibiotics for the first day of his admission, but these were scaled back to doxycycline alone.  He will continue treatment with doxycycline for 12 more days, for a total of 14 days at discharge.   Pending lab work includes: B. Burgdorfi Antibodies, RMSF IgG/IgM; ANA, ANCA; HSV PCR, VDRL CSF PCR Blood cultures and CSF culture were no growth at time of discharge.  CSF fungal culture was negative  2. AKI on CKD, uremia, rhabdomyolysis: Initial labs showed a BUN of 86 with a  Cr of 6.52, baseline around 1.  He further had a CK of ~9500.  All of these values improved with IVF and at the same time his mental status improved as well.  He was tolerating eating and drinking on day of discharge.  His Blood pressure was elevated and he was started back on his enalapril and hctz on discharge, which were held on admission given acute issues. He would benefit from a repeat BMP to evaluate for sustained improvement in renal function.  He had a renal ultrasound which showed no abnormality bilaterally.  He had an abdominal ultrasound which showed hepatic steatosis.   3. Conduction delay on EKG: Seen on EKG X 2.  He was on telemetry throughout his stay with occasional PVCs.  This can be repeated at discharge.   4. 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  CT head and C spine did not show any acute abnormality.  He did have degenerative changes in the cervical spine.  MRI of the brain done at follow up showed a punctate focus of acute to subacute infarction in the left parietal periventricular matter without hemorrhage or mass.  LDL of 58, on statin.  His A1C was 5.8.  He was started on an aspirin at discharge.   5. Chronic Hypertension: His blood pressure medications were initially held due to renal function and low blood pressures.  These rebounded well and he was started back on his home medications on day of discharge.   6. Loose stools related to antibiotics: Should be monitored closely in the outpatient setting, likely related to broad spectrum Antibiotics.    Discharge Vitals:   BP (!) 161/95   Pulse 72   Temp 97.8 F (36.6 C) (Oral)   Resp (!) 26   Ht 5\' 10"  (1.778 m)   Wt 103 kg   SpO2 98%   BMI 32.57 kg/m   Pertinent Labs, Studies, and Procedures:  EXAM: MRI HEAD WITHOUT CONTRAST  TECHNIQUE: Multiplanar, multiecho pulse sequences of the brain and surrounding structures were obtained without intravenous  contrast.  COMPARISON:  None.  FINDINGS: Brain: Punctate focus of diffusion hyperintensity in the left parietal periventricular white matter. No evidence of intracranial hemorrhage. There is no intracranial mass or mass effect. There is no hydrocephalus or extra-axial fluid collection. Minimal patchy foci of T2 hyperintensity in the supratentorial white matter are nonspecific but may reflect minor chronic microvascular ischemic changes. Prominence of the ventricles and sulci reflects minor generalized parenchymal volume loss.  Vascular: Major vessel flow voids at the skull base are preserved.  Skull and upper cervical spine: Normal marrow signal is preserved.  Sinuses/Orbits: Paranasal sinus mucosal thickening, greatest within the left maxillary sinus. Bilateral lens replacements.  Other: Sella is unremarkable.  Mastoid air cells are clear.  IMPRESSION: Punctate focus of acute to subacute infarction in the left parietal periventricular white matter. No hemorrhage or mass.   Electronically Signed  By: Guadlupe Spanish M.D.   On: 03/18/2021 19:54  EXAM: CT HEAD WITHOUT CONTRAST  CT CERVICAL SPINE WITHOUT CONTRAST  TECHNIQUE: Multidetector CT imaging of the head and cervical spine was performed following the standard protocol without intravenous contrast. Multiplanar CT image reconstructions of the cervical spine were also generated.  COMPARISON:  None.  FINDINGS: CT HEAD FINDINGS  Brain: No evidence of acute infarction, hemorrhage, hydrocephalus, extra-axial collection or mass lesion/mass effect. Mild atrophic changes are noted.  Vascular: No hyperdense vessel or unexpected calcification.  Skull: Normal. Negative for fracture or focal lesion.  Sinuses/Orbits: Mucosal thickening is noted within the left maxillary antrum.  Other: Surrounding soft tissue structures are within normal limits.  CT CERVICAL SPINE FINDINGS  Alignment:  Normal.  Skull base and vertebrae: Mild motion artifact is noted. Seven cervical segments are well visualized. Vertebral body height is well maintained. Disc space narrowing is noted from C3 to C7. Mild osteophytic changes are seen at similar levels. Multilevel facet hypertrophic changes are noted with associated neural foraminal narrowing right greater than left. No acute fracture or acute facet abnormality is seen.  Soft tissues and spinal canal: Surrounding soft tissue structures are within normal limits.  Upper chest: Visualized lung apices are unremarkable.  Other: None  IMPRESSION: CT of the head: No acute intracranial abnormality is noted. Mucosal thickening is noted within the left maxillary antrum.  CT of the cervical spine: Multilevel degenerative change without acute abnormality.   Electronically Signed   By: Alcide Clever M.D.   On: 03/18/2021 14:26  EXAM: RENAL / URINARY TRACT ULTRASOUND COMPLETE  COMPARISON:  03/27/2019  FINDINGS: Right Kidney:  Renal measurements: 9.8 x 5.1 x 6.1 cm. = volume: 159 mL. Echogenicity within normal limits. No mass or hydronephrosis visualized.  Left Kidney:  Renal measurements: 11.2 x 6.3 x 5.5 cm. = volume: 203 mL. Echogenicity within normal limits. No mass or hydronephrosis visualized.  Bladder:  Appears normal for degree of bladder distention.  Other:  None.  IMPRESSION: No renal abnormality is noted bilaterally.   Electronically Signed   By: Alcide Clever M.D.   On: 03/18/2021 15:56  EXAM: ULTRASOUND ABDOMEN LIMITED RIGHT UPPER QUADRANT  COMPARISON:  None.  FINDINGS: Gallbladder:  No gallstones or wall thickening visualized. No sonographic Murphy sign noted by sonographer.  Common bile duct:  Diameter: Upper normal at 6.7 mm  Liver:  Diffusely echogenic. No focal hepatic abnormality. Portal vein is patent on color Doppler imaging with normal direction of blood  flow towards the liver.  Other: None.  IMPRESSION: 1. Diffusely echogenic liver consistent with hepatic steatosis and or hepatocellular disease. 2. Negative for gallstones.   Electronically Signed   By: Jasmine Pang M.D.   On: 03/18/2021 18:31   Discharge Instructions: Discharge Instructions    Call MD for:  difficulty breathing, headache or visual disturbances   Complete by: As directed    Call MD for:  extreme fatigue   Complete by: As directed    Call MD for:  hives   Complete by: As directed    Call MD for:  persistant dizziness or light-headedness   Complete by: As directed    Call MD for:  persistant nausea and vomiting   Complete by: As directed    Call MD for:  redness, tenderness, or signs of infection (pain, swelling, redness, odor or green/yellow discharge around incision site)   Complete by: As directed    Call MD for:  severe uncontrolled pain  Complete by: As directed    Call MD for:  temperature >100.4   Complete by: As directed    Diet general   Complete by: As directed    Discharge instructions   Complete by: As directed    Mr. Noah Pacheco - You were admitted for confusion and there was concern for an acute encephalitis (inflammation of the brain) due to tick borne illness.  You will go home on doxycycline 100mg  twice a day for 12 days.  Please remember to wear sunscreen and bug spray.   You were also found to have a small subacute stroke on MRI.  It will be important for you to take a daily aspirin (81mg ) and your statin daily  Please follow up with your primary care doctor in 1-2 weeks.   If you develop worsening confusing, decreased urinary output, new rash or wounds, severe chest pain, severe headache, severe diarrhea or other concerning symptoms, please come back to the hospital.   Thank you and be well!  Debe CoderEmily Ikea Demicco, MD   Increase activity slowly   Complete by: As directed       Signed: Inez CatalinaMullen, Demetrica Zipp B, MD 03/20/2021, 1:21 PM

## 2021-03-20 NOTE — Progress Notes (Addendum)
[facility]    Date of Admission:  03/18/2021   Total days of antibiotics 3           ID: Noah Pacheco is a 70 y.o. male with   Active Problems:   Altered mental status   Heart block   Acute renal failure (HCC)   Transaminitis   Acute encephalopathy    Subjective: Mentation is much improved. Feels back to his baseline. He has had some GI upset, abdominal cramping, with urgency this morning  ROS: denies fever, chills, nights sweats, headache. Some arthralgias and abdominal discomfort  Medications:  . amLODipine  5 mg Oral Daily  . doxycycline  100 mg Oral Q12H  . enalapril  20 mg Oral Daily  . heparin  5,000 Units Subcutaneous Q8H  . hydrochlorothiazide  12.5 mg Oral Daily  . lidocaine (PF)  5 mL Infiltration Once  . sodium chloride flush  3 mL Intravenous Q12H    Objective: Vital signs in last 24 hours: Temp:  [97.8 F (36.6 C)-98.2 F (36.8 C)] 97.8 F (36.6 C) (05/07 0806) Pulse Rate:  [60-79] 72 (05/07 0411) Resp:  [11-26] 26 (05/07 1101) BP: (161-175)/(79-96) 161/95 (05/07 1101) SpO2:  [95 %-99 %] 98 % (05/07 0806) Weight:  [081 kg] 103 kg (05/07 0227) Physical Exam  Constitutional: He is oriented to person, place, and time. He appears well-developed and well-nourished. No distress.  HENT:  Mouth/Throat: Oropharynx is clear and moist. No oropharyngeal exudate.  Cardiovascular: Normal rate, regular rhythm and normal heart sounds. Exam reveals no gallop and no friction rub.  No murmur heard.  Pulmonary/Chest: Effort normal and breath sounds normal. No respiratory distress. He has no wheezes.  Abdominal: Soft. Bowel sounds are normal. He exhibits no distension. There is no tenderness.  Lymphadenopathy:  He has no cervical adenopathy.  Neurological: He is alert and oriented to person, place, and time.  Skin: Skin is warm and dry. No rash noted. No erythema.  Psychiatric: He has a normal mood and affect. His behavior is normal.      Lab Results Recent Labs    03/18/21 2344 03/20/21 0012  WBC 7.0 6.6  HGB 11.9* 11.9*  HCT 35.8* 35.7*  NA 135 140  K 3.8 4.2  CL 102 106  CO2 23 28  BUN 70* 28*  CREATININE 2.73* 0.93   Liver Panel Recent Labs    03/18/21 1820 03/18/21 2344 03/20/21 0012  PROT  --  6.5 6.1*  ALBUMIN  --  3.6 3.3*  AST  --  193* 123*  ALT  --  95* 90*  ALKPHOS  --  80 121  BILITOT 1.5* 1.4* 1.0  BILIDIR 0.3*  --   --   IBILI 1.2*  --   --    Sedimentation Rate No results for input(s): ESRSEDRATE in the last 72 hours. C-Reactive Protein No results for input(s): CRP in the last 72 hours.  Microbiology: reviewed Studies/Results: CT Head Wo Contrast  Result Date: 03/18/2021 CLINICAL DATA:  Recent fall 2 days ago with headaches and neck pain, initial encounter EXAM: CT HEAD WITHOUT CONTRAST CT CERVICAL SPINE WITHOUT CONTRAST TECHNIQUE: Multidetector CT imaging of the head and cervical spine was performed following the standard protocol without intravenous contrast. Multiplanar CT image reconstructions of the cervical spine were also generated. COMPARISON:  None. FINDINGS: CT HEAD FINDINGS Brain: No evidence of acute infarction, hemorrhage, hydrocephalus, extra-axial collection or mass lesion/mass effect. Mild atrophic changes are noted. Vascular: No  hyperdense vessel or unexpected calcification. Skull: Normal. Negative for fracture or focal lesion. Sinuses/Orbits: Mucosal thickening is noted within the left maxillary antrum. Other: Surrounding soft tissue structures are within normal limits. CT CERVICAL SPINE FINDINGS Alignment: Normal. Skull base and vertebrae: Mild motion artifact is noted. Seven cervical segments are well visualized. Vertebral body height is well maintained. Disc space narrowing is noted from C3 to C7. Mild osteophytic changes are seen at similar levels. Multilevel facet hypertrophic changes are noted with associated neural foraminal narrowing right greater than left. No  acute fracture or acute facet abnormality is seen. Soft tissues and spinal canal: Surrounding soft tissue structures are within normal limits. Upper chest: Visualized lung apices are unremarkable. Other: None IMPRESSION: CT of the head: No acute intracranial abnormality is noted. Mucosal thickening is noted within the left maxillary antrum. CT of the cervical spine: Multilevel degenerative change without acute abnormality. Electronically Signed   By: Alcide Clever M.D.   On: 03/18/2021 14:26   CT Cervical Spine Wo Contrast  Result Date: 03/18/2021 CLINICAL DATA:  Recent fall 2 days ago with headaches and neck pain, initial encounter EXAM: CT HEAD WITHOUT CONTRAST CT CERVICAL SPINE WITHOUT CONTRAST TECHNIQUE: Multidetector CT imaging of the head and cervical spine was performed following the standard protocol without intravenous contrast. Multiplanar CT image reconstructions of the cervical spine were also generated. COMPARISON:  None. FINDINGS: CT HEAD FINDINGS Brain: No evidence of acute infarction, hemorrhage, hydrocephalus, extra-axial collection or mass lesion/mass effect. Mild atrophic changes are noted. Vascular: No hyperdense vessel or unexpected calcification. Skull: Normal. Negative for fracture or focal lesion. Sinuses/Orbits: Mucosal thickening is noted within the left maxillary antrum. Other: Surrounding soft tissue structures are within normal limits. CT CERVICAL SPINE FINDINGS Alignment: Normal. Skull base and vertebrae: Mild motion artifact is noted. Seven cervical segments are well visualized. Vertebral body height is well maintained. Disc space narrowing is noted from C3 to C7. Mild osteophytic changes are seen at similar levels. Multilevel facet hypertrophic changes are noted with associated neural foraminal narrowing right greater than left. No acute fracture or acute facet abnormality is seen. Soft tissues and spinal canal: Surrounding soft tissue structures are within normal limits. Upper  chest: Visualized lung apices are unremarkable. Other: None IMPRESSION: CT of the head: No acute intracranial abnormality is noted. Mucosal thickening is noted within the left maxillary antrum. CT of the cervical spine: Multilevel degenerative change without acute abnormality. Electronically Signed   By: Alcide Clever M.D.   On: 03/18/2021 14:26   MR BRAIN WO CONTRAST  Result Date: 03/18/2021 CLINICAL DATA:  Delirium EXAM: MRI HEAD WITHOUT CONTRAST TECHNIQUE: Multiplanar, multiecho pulse sequences of the brain and surrounding structures were obtained without intravenous contrast. COMPARISON:  None. FINDINGS: Brain: Punctate focus of diffusion hyperintensity in the left parietal periventricular white matter. No evidence of intracranial hemorrhage. There is no intracranial mass or mass effect. There is no hydrocephalus or extra-axial fluid collection. Minimal patchy foci of T2 hyperintensity in the supratentorial white matter are nonspecific but may reflect minor chronic microvascular ischemic changes. Prominence of the ventricles and sulci reflects minor generalized parenchymal volume loss. Vascular: Major vessel flow voids at the skull base are preserved. Skull and upper cervical spine: Normal marrow signal is preserved. Sinuses/Orbits: Paranasal sinus mucosal thickening, greatest within the left maxillary sinus. Bilateral lens replacements. Other: Sella is unremarkable.  Mastoid air cells are clear. IMPRESSION: Punctate focus of acute to subacute infarction in the left parietal periventricular white matter. No hemorrhage or mass.  Electronically Signed   By: Guadlupe Spanish M.D.   On: 03/18/2021 19:54   US Renal  Result Date: 03/18/2021 CLINICAL DATA:  Acute renal failure EXAM: RENAL / URINARY TRACT ULTRASOUND COMPLETE COMPARISON:  03/27/2019 FINDINGS: Right Kidney: Renal measurements: 9.8 x 5.1 x 6.1 cm. = volume: 159 mL. Echogenicity within normal limits. No mass or hydronephrosis visualized. Left Kidney:  Renal measurements: 11.2 x 6.3 x 5.5 cm. = volume: 203 mL. Echogenicity within normal limits. No mass or hydronephrosis visualized. Bladder: Appears normal for degree of bladder distention. Other: None. IMPRESSION: No renal abnormality is noted bilaterally. Electronically Signed   By: Alcide Clever M.D.   On: 03/18/2021 15:56   DG Chest Portable 1 View  Result Date: 03/18/2021 CLINICAL DATA:  Altered mental status EXAM: PORTABLE CHEST 1 VIEW COMPARISON:  05/15/2011 FINDINGS: The heart size and mediastinal contours are within normal limits. Both lungs are clear. The visualized skeletal structures are unremarkable. IMPRESSION: No active disease. Electronically Signed   By: Alcide Clever M.D.   On: 03/18/2021 14:27   US Abdomen Limited RUQ (LIVER/GB)  Result Date: 03/18/2021 CLINICAL DATA:  Transaminitis EXAM: ULTRASOUND ABDOMEN LIMITED RIGHT UPPER QUADRANT COMPARISON:  None. FINDINGS: Gallbladder: No gallstones or wall thickening visualized. No sonographic Murphy sign noted by sonographer. Common bile duct: Diameter: Upper normal at 6.7 mm Liver: Diffusely echogenic. No focal hepatic abnormality. Portal vein is patent on color Doppler imaging with normal direction of blood flow towards the liver. Other: None. IMPRESSION: 1. Diffusely echogenic liver consistent with hepatic steatosis and or hepatocellular disease. 2. Negative for gallstones. Electronically Signed   By: Jasmine Pang M.D.   On: 03/18/2021 18:31     Assessment/Plan: Possible/probable tickborne illness = recommend to complete 14 day course of doxycycline 100mg  po bid. Take with food. Gave guidance on sunscreen use due to photosensitivity risk with doxycycline.  Encephalopathy = does not appear to bacterial meningitis. No need for other antibiotics/antivirals  Aki= resolved  transaminitis = improved  Abdominal urgency = possibly could have antibiotic associated diarrhea. Anticipate to improve. If continues over the next 48hrs, recommend to  follow up with pcp  [facility] Infect[facility] Cell: [phone] Pager: [phone]  03/20/2021, 12:44 PM

## 2021-03-21 LAB — HSV 1/2 PCR, CSF
HSV-1 DNA: NEGATIVE
HSV-2 DNA: NEGATIVE

## 2021-03-22 LAB — CSF CULTURE W GRAM STAIN: Culture: NO GROWTH

## 2021-03-22 LAB — VDRL, CSF: VDRL Quant, CSF: NONREACTIVE

## 2021-03-22 LAB — ROCKY MTN SPOTTED FVR ABS PNL(IGG+IGM)
RMSF IgG: UNDETERMINED
RMSF IgM: 0.48 index (ref 0.00–0.89)

## 2021-03-22 LAB — RMSF, IGG, IFA: RMSF, IGG, IFA: <1:64 {titer}

## 2021-03-23 LAB — CULTURE, BLOOD (ROUTINE X 2)
Culture: NO GROWTH
Culture: NO GROWTH
Special Requests: ADEQUATE
Special Requests: ADEQUATE

## 2021-03-23 LAB — ANCA TITERS
Atypical P-ANCA titer: <1:20 {titer}
C-ANCA: <1:20 {titer}
P-ANCA: <1:20 {titer}

## 2021-03-23 LAB — MISC LABCORP TEST (SEND OUT): Labcorp test code: 164226

## 2021-03-23 LAB — B. BURGDORFI ANTIBODIES

## 2021-03-24 LAB — ANAEROBIC CULTURE W GRAM STAIN

## 2021-04-09 LAB — CULTURE, FUNGUS WITHOUT SMEAR

## 2021-04-18 ENCOUNTER — Encounter (HOSPITAL_BASED_OUTPATIENT_CLINIC_OR_DEPARTMENT_OTHER): Payer: Self-pay | Admitting: Emergency Medicine

## 2021-04-18 ENCOUNTER — Other Ambulatory Visit: Payer: Self-pay

## 2021-04-18 ENCOUNTER — Emergency Department (HOSPITAL_BASED_OUTPATIENT_CLINIC_OR_DEPARTMENT_OTHER)
Admission: EM | Admit: 2021-04-18 | Discharge: 2021-04-18 | Disposition: A | Discharge status: Home / Self Care | Payer: Medicare Other | Attending: Emergency Medicine | Admitting: Emergency Medicine

## 2021-04-18 ENCOUNTER — Emergency Department (HOSPITAL_BASED_OUTPATIENT_CLINIC_OR_DEPARTMENT_OTHER): Payer: Medicare Other

## 2021-04-18 DIAGNOSIS — N189 Chronic kidney disease, unspecified: Secondary | ICD-10-CM | POA: Diagnosis not present

## 2021-04-18 DIAGNOSIS — R55 Syncope and collapse: Secondary | ICD-10-CM | POA: Insufficient documentation

## 2021-04-18 DIAGNOSIS — Z7982 Long term (current) use of aspirin: Secondary | ICD-10-CM | POA: Insufficient documentation

## 2021-04-18 DIAGNOSIS — I129 Hypertensive chronic kidney disease with stage 1 through stage 4 chronic kidney disease, or unspecified chronic kidney disease: Secondary | ICD-10-CM | POA: Diagnosis not present

## 2021-04-18 DIAGNOSIS — Z79899 Other long term (current) drug therapy: Secondary | ICD-10-CM | POA: Insufficient documentation

## 2021-04-18 DIAGNOSIS — F1590 Other stimulant use, unspecified, uncomplicated: Secondary | ICD-10-CM | POA: Insufficient documentation

## 2021-04-18 DIAGNOSIS — T402X1A Poisoning by other opioids, accidental (unintentional), initial encounter: Secondary | ICD-10-CM | POA: Diagnosis not present

## 2021-04-18 DIAGNOSIS — R4182 Altered mental status, unspecified: Secondary | ICD-10-CM | POA: Diagnosis present

## 2021-04-18 DIAGNOSIS — T40601A Poisoning by unspecified narcotics, accidental (unintentional), initial encounter: Secondary | ICD-10-CM

## 2021-04-18 LAB — I-STAT VENOUS BLOOD GAS, ED
Acid-Base Excess: 5 mmol/L — ABNORMAL HIGH (ref 0.0–2.0)
Bicarbonate: 31.9 mmol/L — ABNORMAL HIGH (ref 20.0–28.0)
Calcium, Ion: 1.21 mmol/L (ref 1.15–1.40)
HCT: 43 % (ref 39.0–52.0)
Hemoglobin: 14.6 g/dL (ref 13.0–17.0)
O2 Saturation: 79 %
Patient temperature: 98.3
Potassium: 3.8 mmol/L (ref 3.5–5.1)
Sodium: 136 mmol/L (ref 135–145)
TCO2: 34 mmol/L — ABNORMAL HIGH (ref 22–32)
pCO2, Ven: 56.8 mmHg (ref 44.0–60.0)
pH, Ven: 7.356 (ref 7.250–7.430)
pO2, Ven: 46 mmHg — ABNORMAL HIGH (ref 32.0–45.0)

## 2021-04-18 LAB — COMPREHENSIVE METABOLIC PANEL
ALT: 42 U/L (ref 0–44)
AST: 34 U/L (ref 15–41)
Albumin: 4.4 g/dL (ref 3.5–5.0)
Alkaline Phosphatase: 96 U/L (ref 38–126)
Anion gap: 9 (ref 5–15)
BUN: 24 mg/dL — ABNORMAL HIGH (ref 8–23)
CO2: 32 mmol/L (ref 22–32)
Calcium: 9.4 mg/dL (ref 8.9–10.3)
Chloride: 96 mmol/L — ABNORMAL LOW (ref 98–111)
Creatinine, Ser: 1.26 mg/dL — ABNORMAL HIGH (ref 0.61–1.24)
GFR, Estimated: >60 mL/min (ref >60)
Glucose, Bld: 137 mg/dL — ABNORMAL HIGH (ref 70–99)
Potassium: 3.4 mmol/L — ABNORMAL LOW (ref 3.5–5.1)
Sodium: 137 mmol/L (ref 135–145)
Total Bilirubin: 1 mg/dL (ref 0.3–1.2)
Total Protein: 7.1 g/dL (ref 6.5–8.1)

## 2021-04-18 LAB — CBC
HCT: 42.9 % (ref 39.0–52.0)
Hemoglobin: 14.3 g/dL (ref 13.0–17.0)
MCH: 32.5 pg (ref 26.0–34.0)
MCHC: 33.3 g/dL (ref 30.0–36.0)
MCV: 97.5 fL (ref 80.0–100.0)
Platelets: 273 10*3/uL (ref 150–400)
RBC: 4.4 MIL/uL (ref 4.22–5.81)
RDW: 12.5 % (ref 11.5–15.5)
WBC: 14.8 10*3/uL — ABNORMAL HIGH (ref 4.0–10.5)
nRBC: 0 % (ref 0.0–0.2)

## 2021-04-18 LAB — RAPID URINE DRUG SCREEN, HOSP PERFORMED
Amphetamines: NOT DETECTED
Barbiturates: NOT DETECTED
Benzodiazepines: POSITIVE — AB
Cocaine: NOT DETECTED
Opiates: POSITIVE — AB
Tetrahydrocannabinol: NOT DETECTED

## 2021-04-18 LAB — ETHANOL: Alcohol, Ethyl (B): <10 mg/dL (ref <10)

## 2021-04-18 LAB — SALICYLATE LEVEL: Salicylate Lvl: <7 mg/dL — ABNORMAL LOW (ref 7.0–30.0)

## 2021-04-18 LAB — ACETAMINOPHEN LEVEL: Acetaminophen (Tylenol), Serum: <10 ug/mL — ABNORMAL LOW (ref 10–30)

## 2021-04-18 LAB — CBG MONITORING, ED: Glucose-Capillary: 146 mg/dL — ABNORMAL HIGH (ref 70–99)

## 2021-04-18 LAB — BRAIN NATRIURETIC PEPTIDE: B Natriuretic Peptide: 9.9 pg/mL (ref 0.0–100.0)

## 2021-04-18 IMAGING — DX DG CHEST 1V PORT
1 series · 1 of 1 positions shown · non-contrast
Comparison: Portable exam [RJ] hours compared to [DATE]

CLINICAL DATA: Borderline low and fluctuating oxygen saturations,
loss of consciousness, history hypertension

EXAM:
PORTABLE CHEST 1 VIEW

[chest]
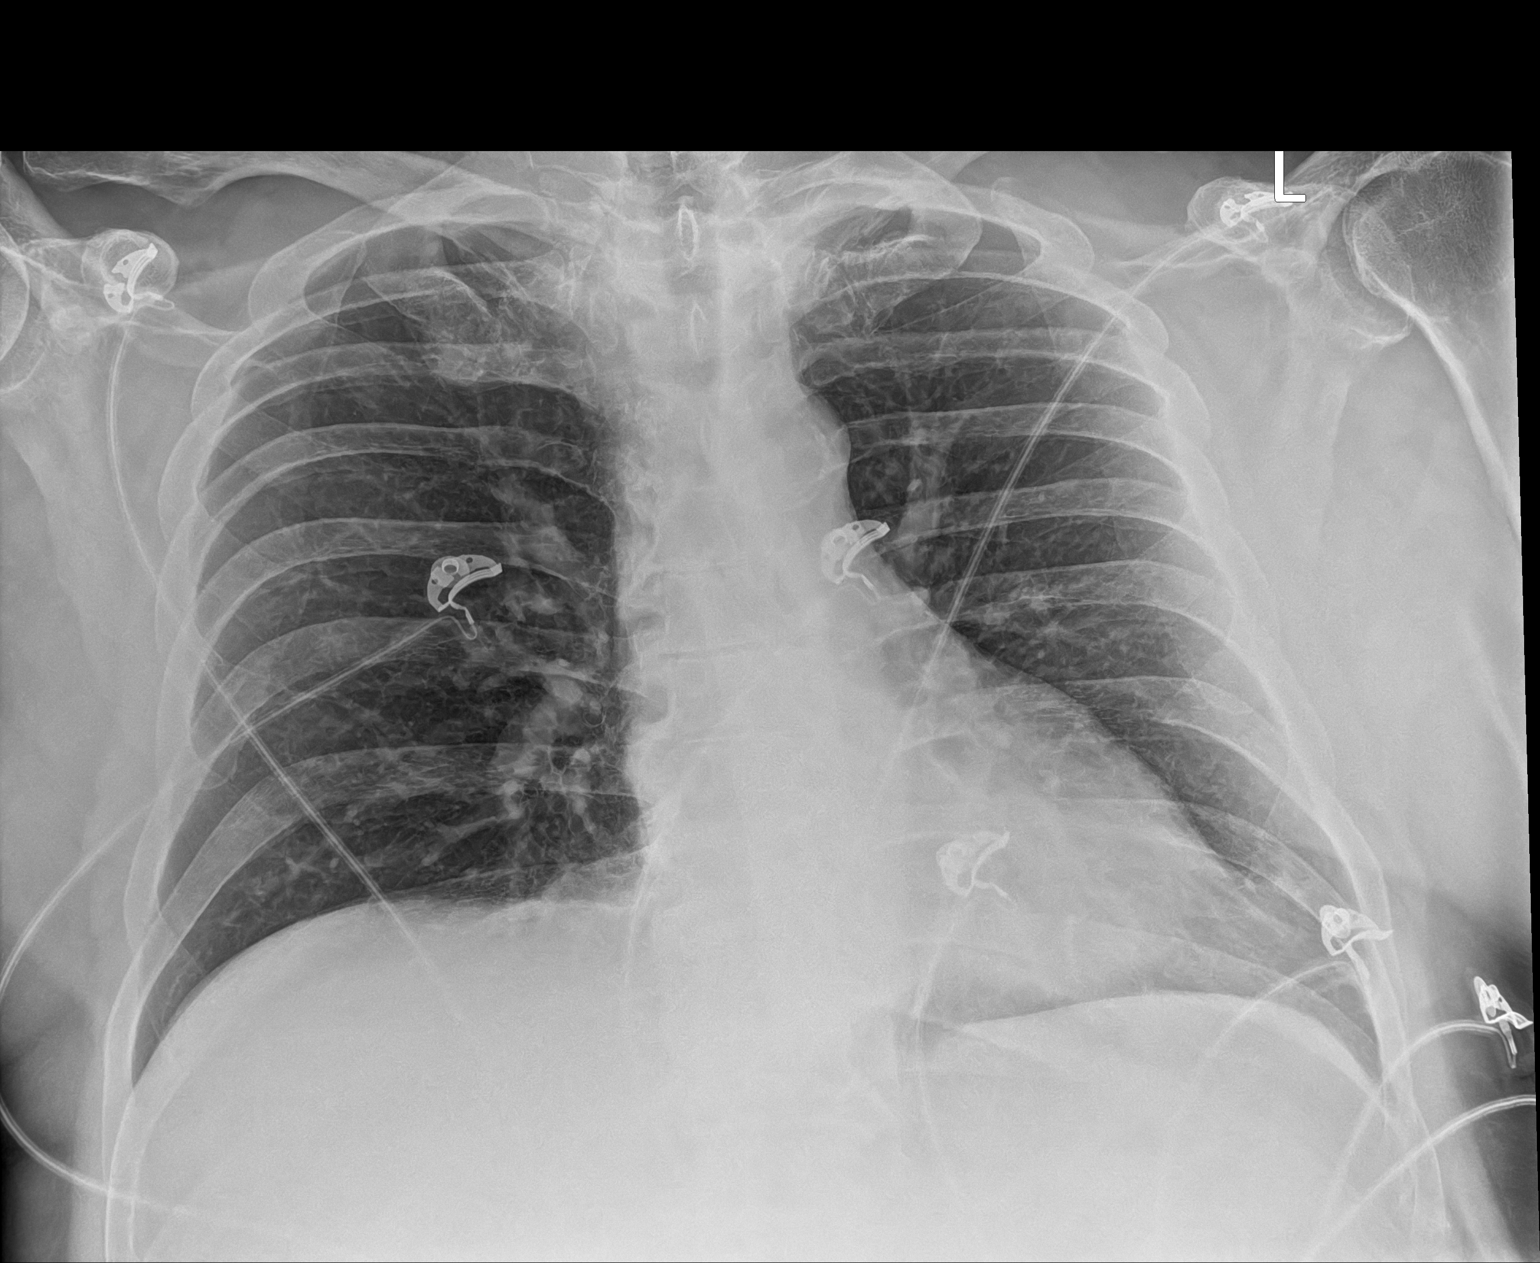

[1 of 1 positions shown; findings below may reference images not displayed]

FINDINGS: Normal heart size, mediastinal contours, and pulmonary vascularity.

Atherosclerotic calcification aorta.

Lungs clear.

No infiltrate, pleural effusion, or pneumothorax.

Osseous structures unremarkable.
IMPRESSION: No acute abnormalities.

Aortic Atherosclerosis ([RJ]-[RJ]).

## 2021-04-18 NOTE — ED Notes (Signed)
Pt attempting to void and provide urine sample.

## 2021-04-18 NOTE — ED Triage Notes (Addendum)
Pt arrives to ED with c/o loss of consciousness this afternoon. Pt was found in his car in a busy intersection unresponsive and agonal breathing. EMS found pt with pinpoint pupils. EMS gave 0.5 Narcan intranasally. Pt more alert and oriented after administration. Pt A&O x4 on arrival to ED. Pt taking Oxycodone for chronic neck pain. Took x3 5mg  immediate release Oxy tablets this morning for his neck pain.

## 2021-04-18 NOTE — ED Notes (Signed)
Patient had a visitor in the room with him. This RN was intentionally vague with discharge information due to the nature of his visit. Discussed that all information regarding his visit today was contained within discharge papers which were kept folded to maintain privacy. Did not divulge any medical information in front of patient's visitor (who identified herself as his wife).  Patient denied any questions regarding the visit and had had a private conversation with MD prior to discharge. Denied needs for work note.
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
## 2021-04-19 ENCOUNTER — Encounter (HOSPITAL_COMMUNITY): Payer: Self-pay | Admitting: Emergency Medicine

## 2021-04-19 ENCOUNTER — Other Ambulatory Visit: Payer: Self-pay

## 2021-04-19 ENCOUNTER — Emergency Department (HOSPITAL_COMMUNITY)
Admission: EM | Admit: 2021-04-19 | Discharge: 2021-04-19 | Disposition: A | Discharge status: Home / Self Care | Payer: Medicare Other | Attending: Emergency Medicine | Admitting: Emergency Medicine

## 2021-04-19 ENCOUNTER — Emergency Department (HOSPITAL_COMMUNITY): Payer: Medicare Other

## 2021-04-19 DIAGNOSIS — T402X5A Adverse effect of other opioids, initial encounter: Secondary | ICD-10-CM

## 2021-04-19 DIAGNOSIS — N189 Chronic kidney disease, unspecified: Secondary | ICD-10-CM | POA: Insufficient documentation

## 2021-04-19 DIAGNOSIS — R4182 Altered mental status, unspecified: Secondary | ICD-10-CM | POA: Insufficient documentation

## 2021-04-19 DIAGNOSIS — Z20822 Contact with and (suspected) exposure to covid-19: Secondary | ICD-10-CM | POA: Insufficient documentation

## 2021-04-19 DIAGNOSIS — R464 Slowness and poor responsiveness: Secondary | ICD-10-CM | POA: Insufficient documentation

## 2021-04-19 DIAGNOSIS — Z7982 Long term (current) use of aspirin: Secondary | ICD-10-CM | POA: Diagnosis not present

## 2021-04-19 DIAGNOSIS — I129 Hypertensive chronic kidney disease with stage 1 through stage 4 chronic kidney disease, or unspecified chronic kidney disease: Secondary | ICD-10-CM | POA: Diagnosis not present

## 2021-04-19 DIAGNOSIS — Z79899 Other long term (current) drug therapy: Secondary | ICD-10-CM | POA: Insufficient documentation

## 2021-04-19 LAB — CBC
HCT: 41.4 % (ref 39.0–52.0)
Hemoglobin: 13.9 g/dL (ref 13.0–17.0)
MCH: 32.7 pg (ref 26.0–34.0)
MCHC: 33.6 g/dL (ref 30.0–36.0)
MCV: 97.4 fL (ref 80.0–100.0)
Platelets: 214 10*3/uL (ref 150–400)
RBC: 4.25 MIL/uL (ref 4.22–5.81)
RDW: 12.4 % (ref 11.5–15.5)
WBC: 10.7 10*3/uL — ABNORMAL HIGH (ref 4.0–10.5)
nRBC: 0 % (ref 0.0–0.2)

## 2021-04-19 LAB — I-STAT CHEM 8, ED
BUN: 24 mg/dL — ABNORMAL HIGH (ref 8–23)
Calcium, Ion: 1.21 mmol/L (ref 1.15–1.40)
Chloride: 99 mmol/L (ref 98–111)
Creatinine, Ser: 1.1 mg/dL (ref 0.61–1.24)
Glucose, Bld: 131 mg/dL — ABNORMAL HIGH (ref 70–99)
HCT: 41 % (ref 39.0–52.0)
Hemoglobin: 13.9 g/dL (ref 13.0–17.0)
Potassium: 3.6 mmol/L (ref 3.5–5.1)
Sodium: 140 mmol/L (ref 135–145)
TCO2: 31 mmol/L (ref 22–32)

## 2021-04-19 LAB — COMPREHENSIVE METABOLIC PANEL
ALT: 40 U/L (ref 0–44)
AST: 33 U/L (ref 15–41)
Albumin: 4.2 g/dL (ref 3.5–5.0)
Alkaline Phosphatase: 85 U/L (ref 38–126)
Anion gap: 11 (ref 5–15)
BUN: 22 mg/dL (ref 8–23)
CO2: 28 mmol/L (ref 22–32)
Calcium: 9.6 mg/dL (ref 8.9–10.3)
Chloride: 99 mmol/L (ref 98–111)
Creatinine, Ser: 1.15 mg/dL (ref 0.61–1.24)
GFR, Estimated: >60 mL/min (ref >60)
Glucose, Bld: 132 mg/dL — ABNORMAL HIGH (ref 70–99)
Potassium: 3.5 mmol/L (ref 3.5–5.1)
Sodium: 138 mmol/L (ref 135–145)
Total Bilirubin: 0.8 mg/dL (ref 0.3–1.2)
Total Protein: 7.2 g/dL (ref 6.5–8.1)

## 2021-04-19 LAB — ETHANOL: Alcohol, Ethyl (B): <10 mg/dL (ref <10)

## 2021-04-19 LAB — DIFFERENTIAL
Abs Immature Granulocytes: 0.09 10*3/uL — ABNORMAL HIGH (ref 0.00–0.07)
Basophils Absolute: 0 10*3/uL (ref 0.0–0.1)
Basophils Relative: 0 %
Eosinophils Absolute: 0.2 10*3/uL (ref 0.0–0.5)
Eosinophils Relative: 2 %
Immature Granulocytes: 1 %
Lymphocytes Relative: 15 %
Lymphs Abs: 1.6 10*3/uL (ref 0.7–4.0)
Monocytes Absolute: 0.6 10*3/uL (ref 0.1–1.0)
Monocytes Relative: 6 %
Neutro Abs: 8.1 10*3/uL — ABNORMAL HIGH (ref 1.7–7.7)
Neutrophils Relative %: 76 %

## 2021-04-19 LAB — RESP PANEL BY RT-PCR (FLU A&B, COVID) ARPGX2
Influenza A by PCR: NEGATIVE
Influenza B by PCR: NEGATIVE
SARS Coronavirus 2 by RT PCR: NEGATIVE

## 2021-04-19 LAB — PROTIME-INR
INR: 1 (ref 0.8–1.2)
Prothrombin Time: 12.9 seconds (ref 11.4–15.2)

## 2021-04-19 LAB — APTT: aPTT: 23 seconds — ABNORMAL LOW (ref 24–36)

## 2021-04-19 LAB — CBG MONITORING, ED: Glucose-Capillary: 123 mg/dL — ABNORMAL HIGH (ref 70–99)

## 2021-04-19 IMAGING — CT CT HEAD CODE STROKE
4 series · 15 of 47 positions shown, 17 images · non-contrast
Comparison: Previous MRI from [DATE]

CLINICAL DATA: Code stroke. Initial evaluation for neuro deficit,
stroke suspected. Confusion.

EXAM:
CT HEAD WITHOUT CONTRAST
TECHNIQUE: Contiguous axial images were obtained from the base of the skull
through the vertex without intravenous contrast.

[Series 3: head without · axial · non-contrast · 0.49mm/px · z∈[-192,-62]mm · 7 of 36 slices shown, 9 images]
[im 5/36  brain]
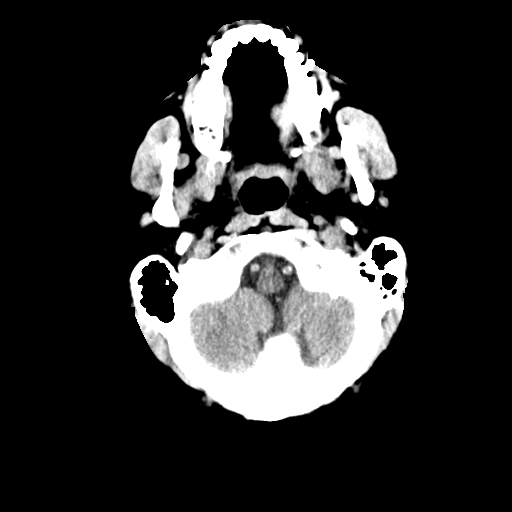
[im 5/36  bone]
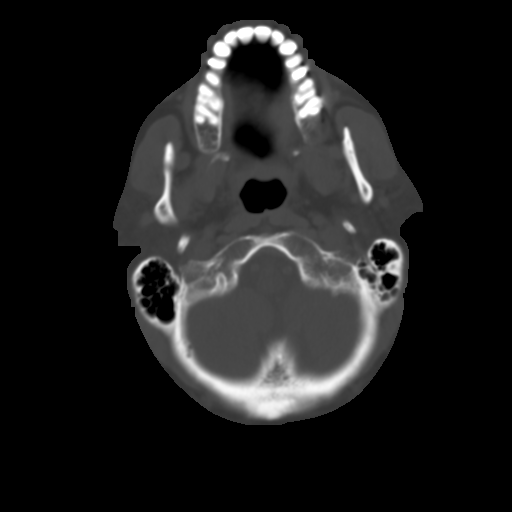
[im 9/36  brain]
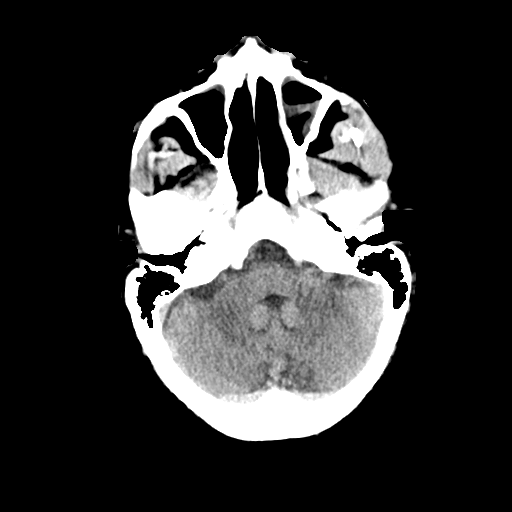
[im 14/36  brain]
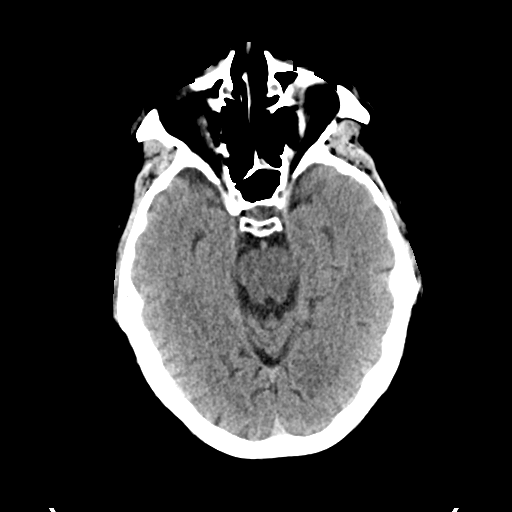
[im 18/36  brain]
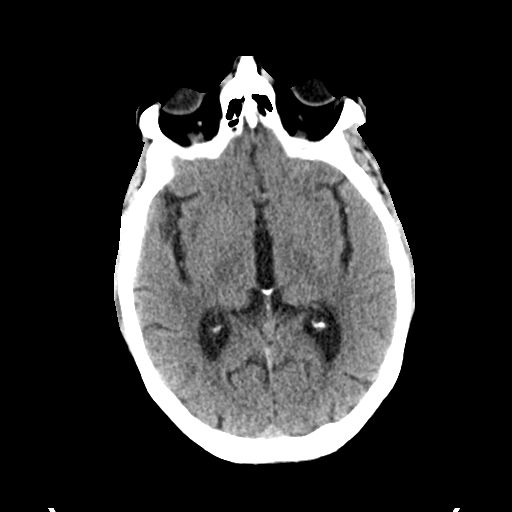
[im 22/36  brain]
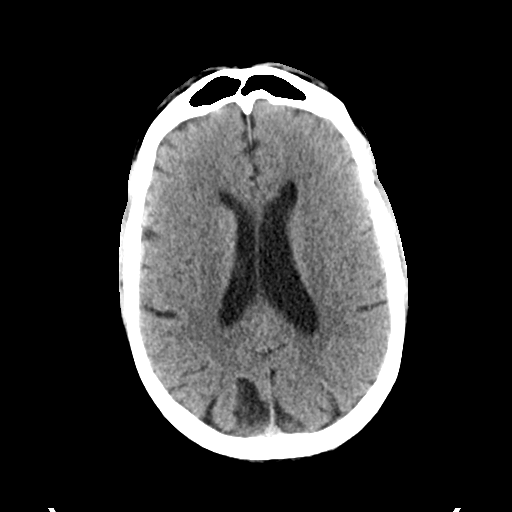
[im 22/36  bone]
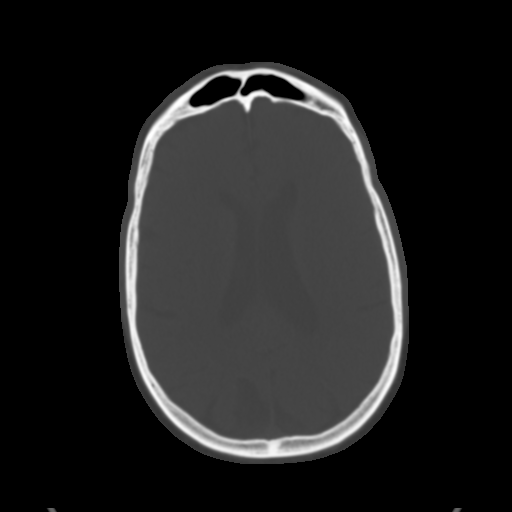
[im 27/36  brain]
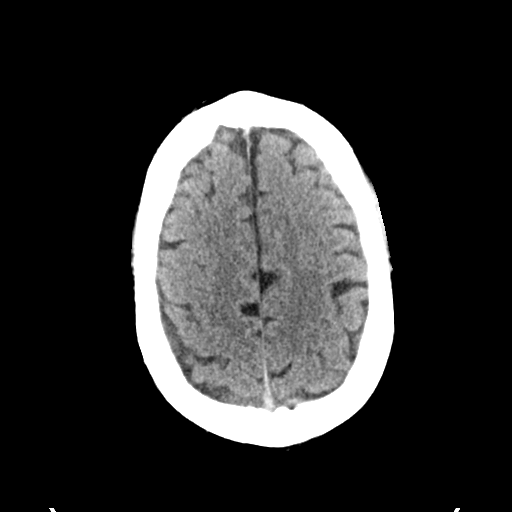
[im 31/36  brain]
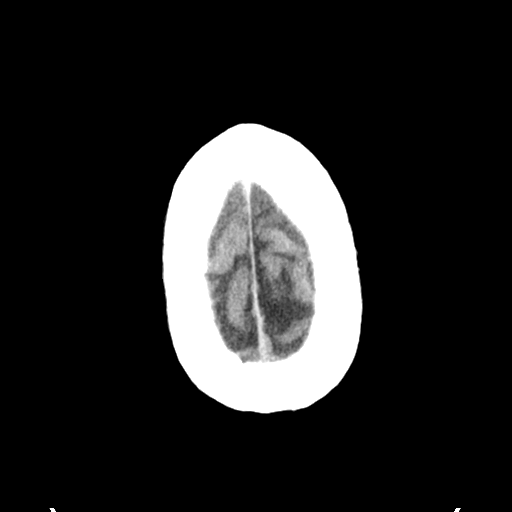

[Series 4: head bone · axial · 0.49mm/px · z∈[-196,-178]mm · 2 of 90 slices shown]
[im 9/90  bone]
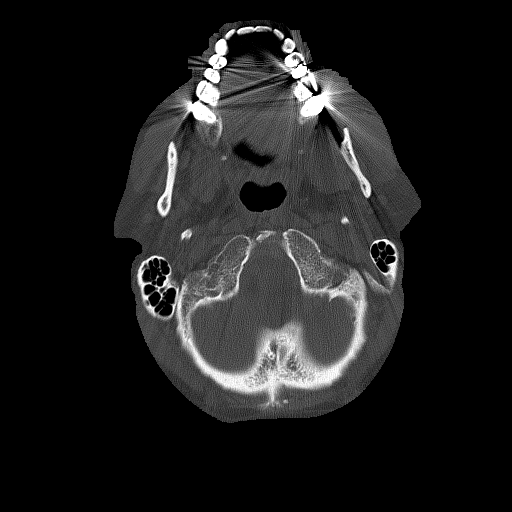
[im 18/90  bone]
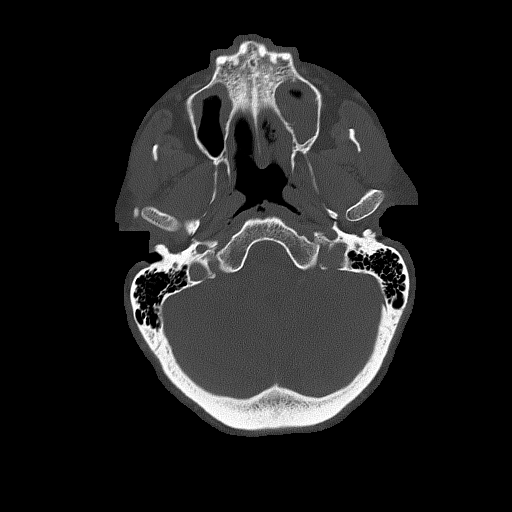

[Series 5: head without cor · coronal · non-contrast · 0.37mm/px · 3 of 73 slices shown]
[im 25/73  brain]
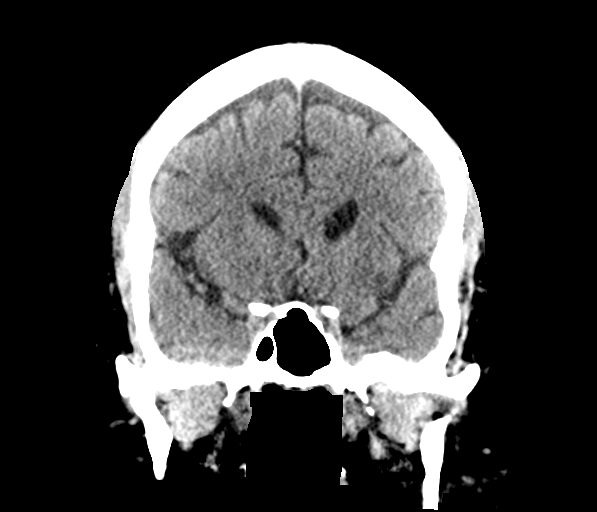
[im 33/73  brain]
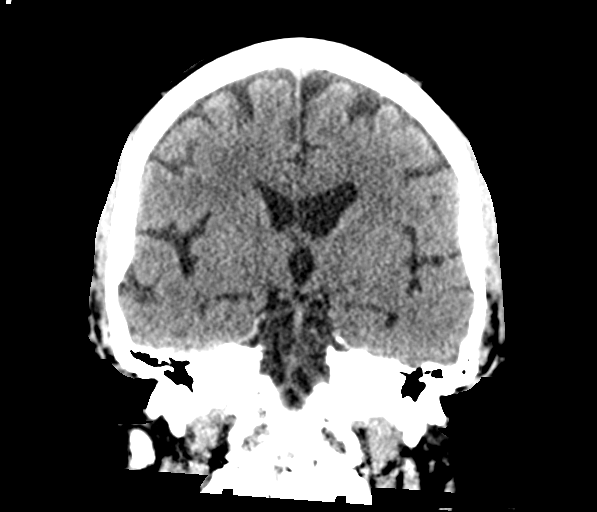
[im 41/73  brain]
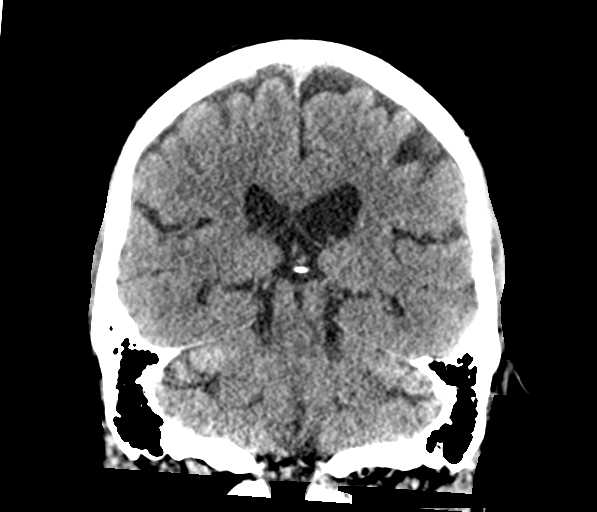

[Series 6: head without sag · sagittal · non-contrast · 0.35mm/px · 3 of 67 slices shown]
[im 23/67  brain]
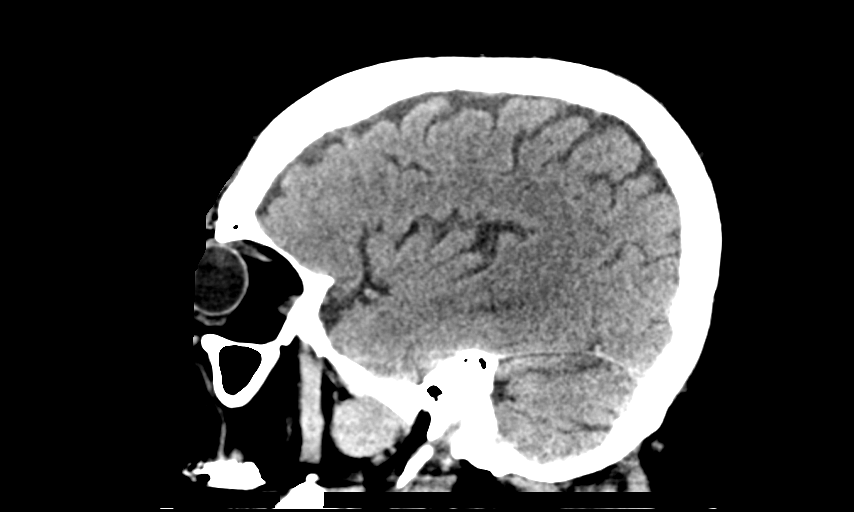
[im 34/67  brain]
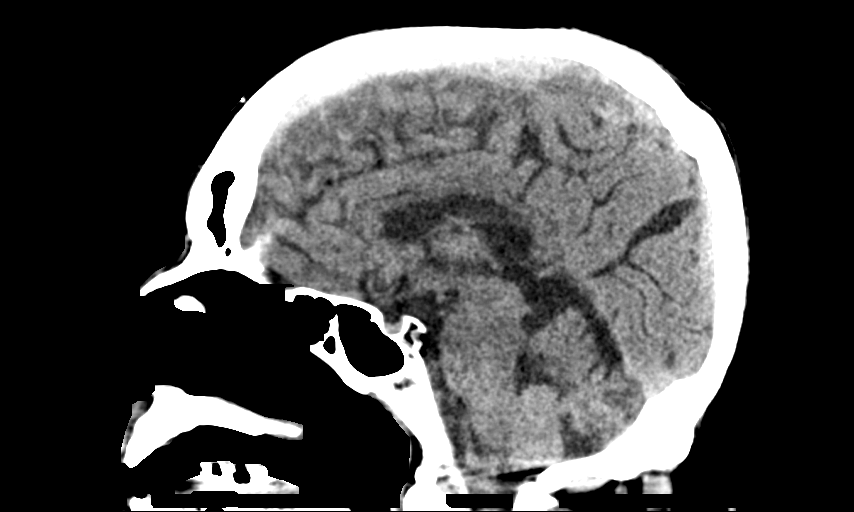
[im 45/67  brain]
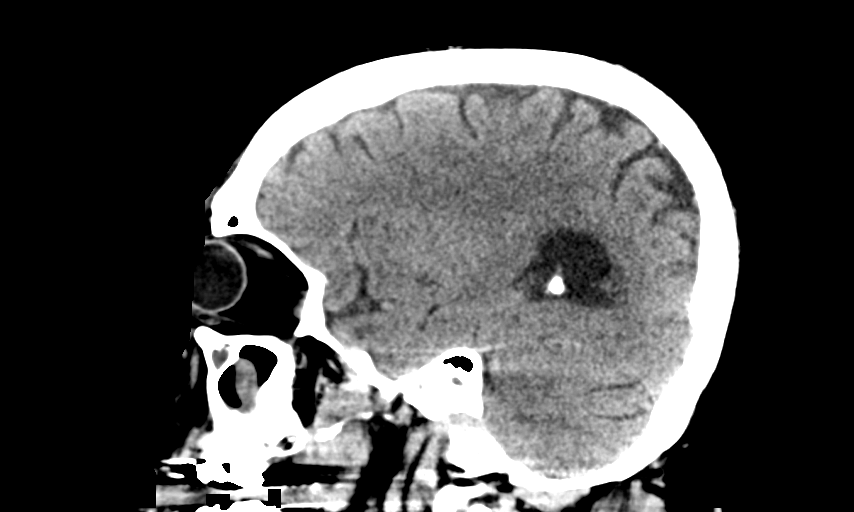

[15 of 47 positions shown; findings below may reference images not displayed]

FINDINGS: Brain: Age-related cerebral atrophy. Recently identified punctate
left parietal infarct not visible by CT. No other acute large vessel
territory infarct. No acute intracranial hemorrhage. No mass lesion,
mass effect or midline shift. No hydrocephalus or extra-axial fluid
collection.

Vascular: No hyperdense vessel.

Skull: Scalp soft tissues and calvarium within normal limits.

Sinuses/Orbits: Globes and orbital soft tissues normal. Chronic
maxillary sinusitis noted, left worse than right. No mastoid
effusion.

Other: None.

ASPECTS (Alberta Stroke Program Early CT Score)

- Ganglionic level infarction (caudate, lentiform nuclei, internal
capsule, insula, M1-M3 cortex): 7

- Supraganglionic infarction (M4-M6 cortex): 3

Total score (0-10 with 10 being normal): 10
IMPRESSION: 1. No acute intracranial infarct or other abnormality.
2. ASPECTS is 10.

These results were communicated to Dr. FEDERICK At [DATE] pmon
[DATE]by text page via the [employer] messaging system.
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
## 2021-04-19 NOTE — ED Notes (Signed)
RN reviewed discharge instructions. Follow up care reviewed, pt had no further questions 
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
## 2021-04-19 NOTE — ED Notes (Signed)
Code stroke canceled at 2128
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
## 2021-04-19 NOTE — Discharge Instructions (Addendum)
Return for any problem.   Avoid the use of narcotics in the future.    Your symptoms today are secondary to use of oxycodone.

## 2021-04-20 ENCOUNTER — Other Ambulatory Visit: Payer: Self-pay

## 2021-04-20 ENCOUNTER — Encounter (HOSPITAL_COMMUNITY): Payer: Self-pay

## 2021-04-20 ENCOUNTER — Emergency Department (HOSPITAL_COMMUNITY): Payer: Medicare Other

## 2021-04-20 ENCOUNTER — Emergency Department (HOSPITAL_COMMUNITY)
Admission: EM | Admit: 2021-04-20 | Discharge: 2021-04-20 | Disposition: A | Discharge status: Home / Self Care | Payer: Medicare Other | Attending: Emergency Medicine | Admitting: Emergency Medicine

## 2021-04-20 DIAGNOSIS — I129 Hypertensive chronic kidney disease with stage 1 through stage 4 chronic kidney disease, or unspecified chronic kidney disease: Secondary | ICD-10-CM | POA: Diagnosis not present

## 2021-04-20 DIAGNOSIS — R4182 Altered mental status, unspecified: Secondary | ICD-10-CM | POA: Insufficient documentation

## 2021-04-20 DIAGNOSIS — Z79899 Other long term (current) drug therapy: Secondary | ICD-10-CM | POA: Diagnosis not present

## 2021-04-20 DIAGNOSIS — Z7982 Long term (current) use of aspirin: Secondary | ICD-10-CM | POA: Insufficient documentation

## 2021-04-20 DIAGNOSIS — R531 Weakness: Secondary | ICD-10-CM | POA: Insufficient documentation

## 2021-04-20 DIAGNOSIS — N189 Chronic kidney disease, unspecified: Secondary | ICD-10-CM | POA: Diagnosis not present

## 2021-04-20 LAB — CBC WITH DIFFERENTIAL/PLATELET
Abs Immature Granulocytes: 0.07 10*3/uL (ref 0.00–0.07)
Basophils Absolute: 0 10*3/uL (ref 0.0–0.1)
Basophils Relative: 0 %
Eosinophils Absolute: 0.2 10*3/uL (ref 0.0–0.5)
Eosinophils Relative: 1 %
HCT: 39.2 % (ref 39.0–52.0)
Hemoglobin: 13.1 g/dL (ref 13.0–17.0)
Immature Granulocytes: 1 %
Lymphocytes Relative: 10 %
Lymphs Abs: 1.2 10*3/uL (ref 0.7–4.0)
MCH: 33.1 pg (ref 26.0–34.0)
MCHC: 33.4 g/dL (ref 30.0–36.0)
MCV: 99 fL (ref 80.0–100.0)
Monocytes Absolute: 0.8 10*3/uL (ref 0.1–1.0)
Monocytes Relative: 6 %
Neutro Abs: 10.1 10*3/uL — ABNORMAL HIGH (ref 1.7–7.7)
Neutrophils Relative %: 82 %
Platelets: 191 10*3/uL (ref 150–400)
RBC: 3.96 MIL/uL — ABNORMAL LOW (ref 4.22–5.81)
RDW: 12.7 % (ref 11.5–15.5)
WBC: 12.4 10*3/uL — ABNORMAL HIGH (ref 4.0–10.5)
nRBC: 0 % (ref 0.0–0.2)

## 2021-04-20 LAB — PROTIME-INR
INR: 1 (ref 0.8–1.2)
Prothrombin Time: 13 seconds (ref 11.4–15.2)

## 2021-04-20 LAB — COMPREHENSIVE METABOLIC PANEL
ALT: 36 U/L (ref 0–44)
AST: 31 U/L (ref 15–41)
Albumin: 3.9 g/dL (ref 3.5–5.0)
Alkaline Phosphatase: 82 U/L (ref 38–126)
Anion gap: 8 (ref 5–15)
BUN: 25 mg/dL — ABNORMAL HIGH (ref 8–23)
CO2: 31 mmol/L (ref 22–32)
Calcium: 9.3 mg/dL (ref 8.9–10.3)
Chloride: 98 mmol/L (ref 98–111)
Creatinine, Ser: 1.37 mg/dL — ABNORMAL HIGH (ref 0.61–1.24)
GFR, Estimated: 55 mL/min — ABNORMAL LOW (ref >60)
Glucose, Bld: 122 mg/dL — ABNORMAL HIGH (ref 70–99)
Potassium: 3.2 mmol/L — ABNORMAL LOW (ref 3.5–5.1)
Sodium: 137 mmol/L (ref 135–145)
Total Bilirubin: 0.7 mg/dL (ref 0.3–1.2)
Total Protein: 6.4 g/dL — ABNORMAL LOW (ref 6.5–8.1)

## 2021-04-20 LAB — PHOSPHORUS: Phosphorus: 3.2 mg/dL (ref 2.5–4.6)

## 2021-04-20 LAB — TROPONIN I (HIGH SENSITIVITY)
Troponin I (High Sensitivity): 16 ng/L (ref <18)
Troponin I (High Sensitivity): 18 ng/L — ABNORMAL HIGH (ref <18)

## 2021-04-20 LAB — ACETAMINOPHEN LEVEL: Acetaminophen (Tylenol), Serum: <10 ug/mL — ABNORMAL LOW (ref 10–30)

## 2021-04-20 LAB — RAPID URINE DRUG SCREEN, HOSP PERFORMED
Amphetamines: NOT DETECTED
Barbiturates: NOT DETECTED
Benzodiazepines: POSITIVE — AB
Cocaine: NOT DETECTED
Opiates: NOT DETECTED
Tetrahydrocannabinol: NOT DETECTED

## 2021-04-20 LAB — AMMONIA: Ammonia: 33 umol/L (ref 9–35)

## 2021-04-20 LAB — LIPASE, BLOOD: Lipase: 28 U/L (ref 11–51)

## 2021-04-20 LAB — GAMMA GT: GGT: 90 U/L — ABNORMAL HIGH (ref 7–50)

## 2021-04-20 LAB — ETHANOL: Alcohol, Ethyl (B): <10 mg/dL (ref <10)

## 2021-04-20 LAB — MAGNESIUM: Magnesium: 2.1 mg/dL (ref 1.7–2.4)

## 2021-04-20 LAB — CK: Total CK: 252 U/L (ref 49–397)
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
## 2021-04-20 IMAGING — MR MR HEAD WO/W CM
6 of 12 series · 26 of 48 positions shown · IV contrast (gadavist)
Comparison: MRI head [DATE].

CLINICAL DATA: Neuro deficit, acute stroke suspected.

EXAM:
MRI HEAD WITHOUT AND WITH CONTRAST
TECHNIQUE: Multiplanar, multiecho pulse sequences of the brain and surrounding
structures were obtained without and with intravenous contrast.
CONTRAST:  10mL GADAVIST GADOBUTROL 1 MMOL/ML IV SOLN

[Series 2: DWI · axial · 3.0mm · 0.94mm/px · z∈[-71,+81]mm · 9 of 104 slices shown (1 of 2)]
[im 1/104]
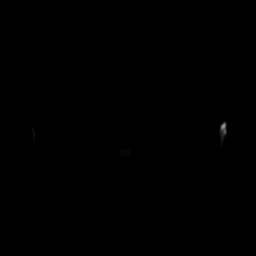
[im 13/104]
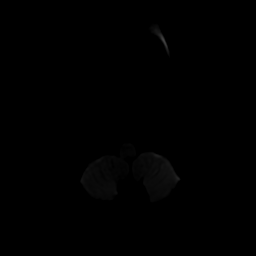
[im 26/104]
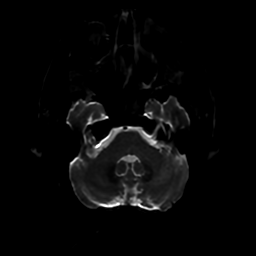
[im 39/104]
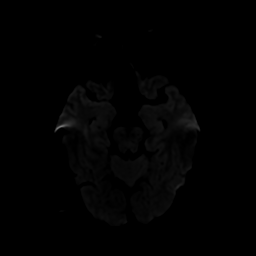
[im 52/104]
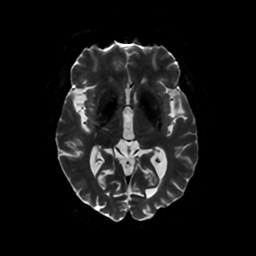
[im 65/104]
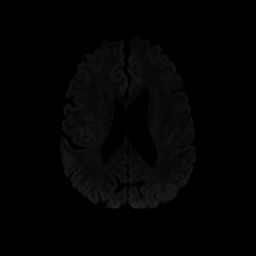
[im 78/104]
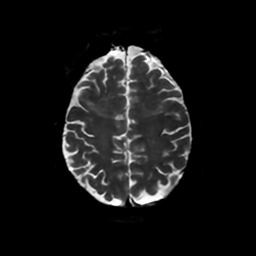
[im 91/104]
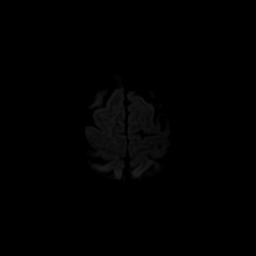
[im 104/104]
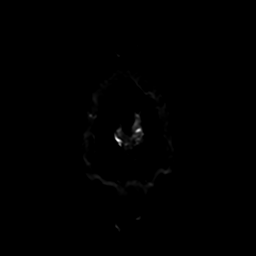

[Series 3: DWI · coronal · 4.0mm · 0.94mm/px · 6 of 74 slices shown (2 of 2)]
[im 1/74]
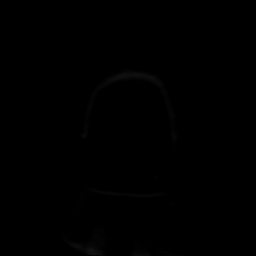
[im 15/74]
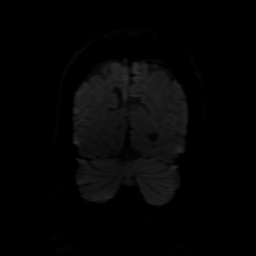
[im 30/74]
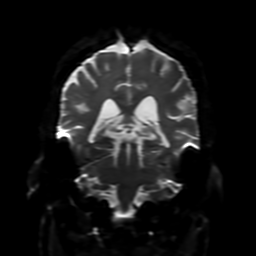
[im 44/74]
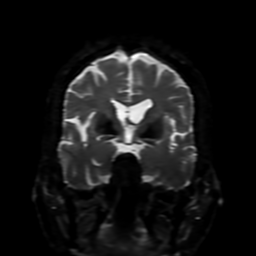
[im 59/74]
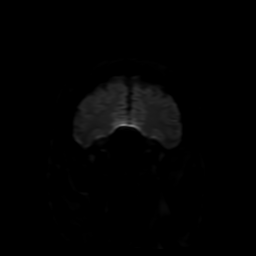
[im 74/74]
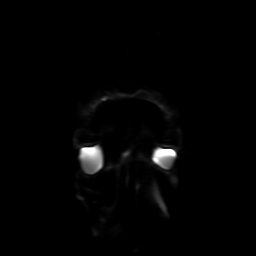

[Series 4: FLAIR · sagittal · 5.0mm · 0.23mm/px · 2 of 23 slices shown (1 of 2)]
[im 1/23]
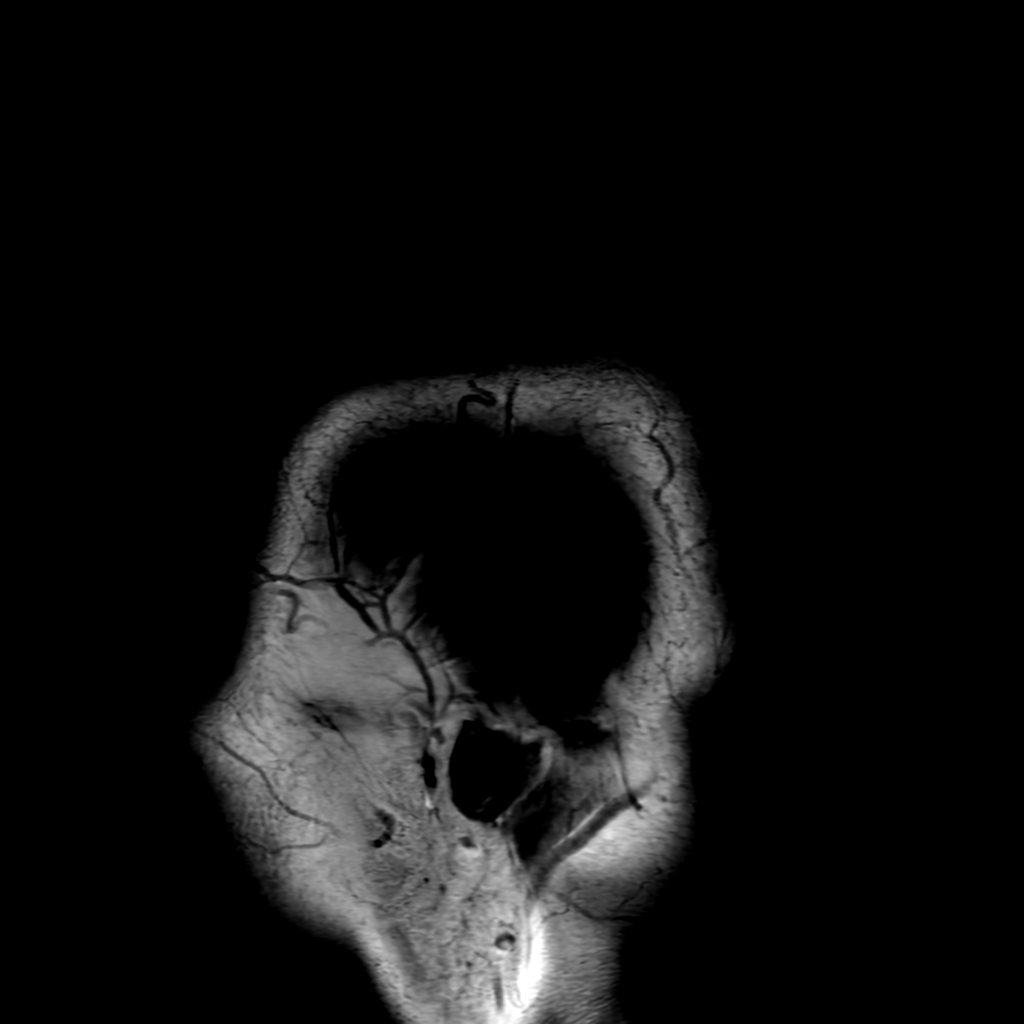
[im 23/23]
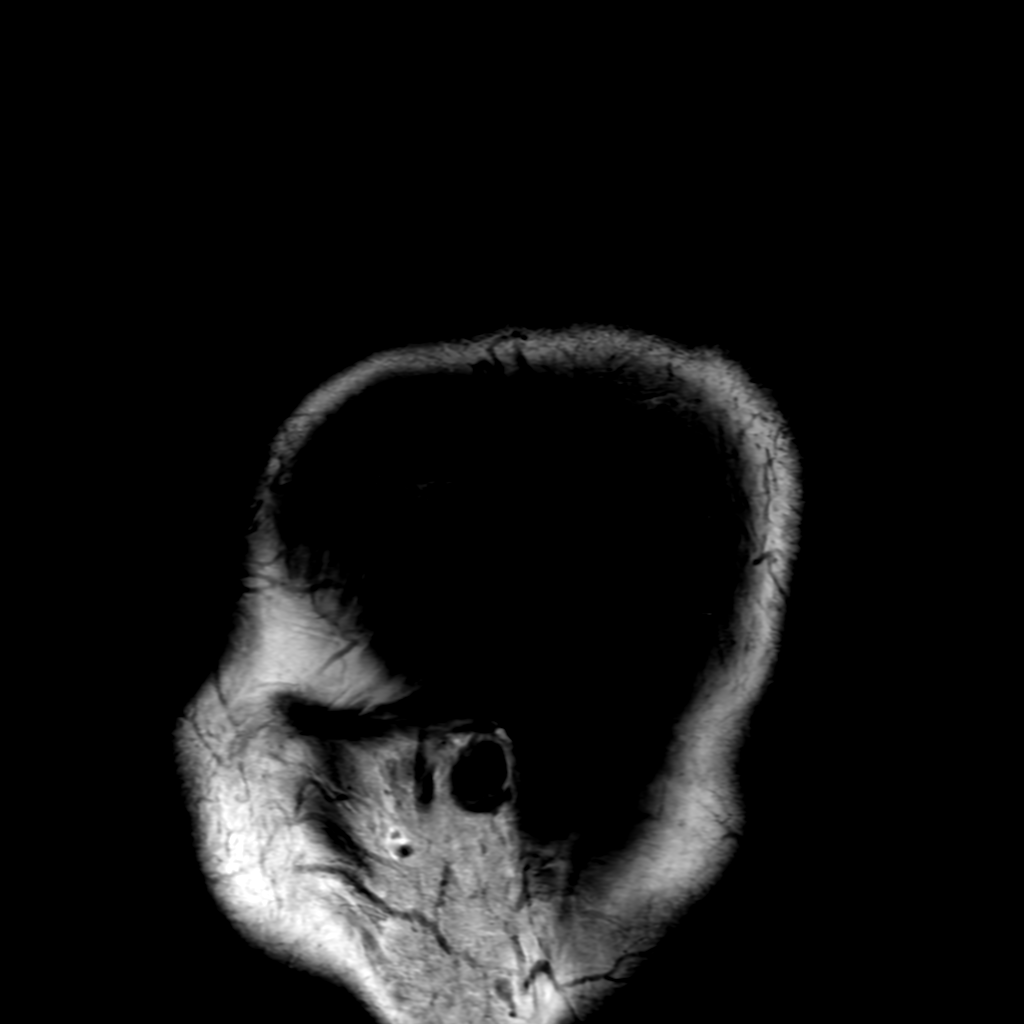

[Series 6: FLAIR · axial · 3.0mm · 0.45mm/px · z∈[-70,+80]mm · 2 of 26 slices shown (2 of 2)]
[im 1/26]
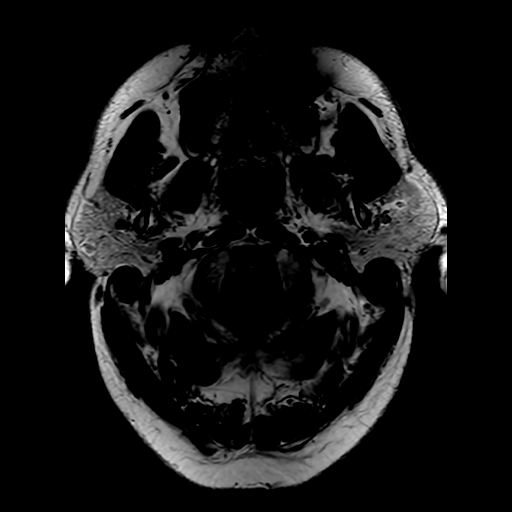
[im 26/26]
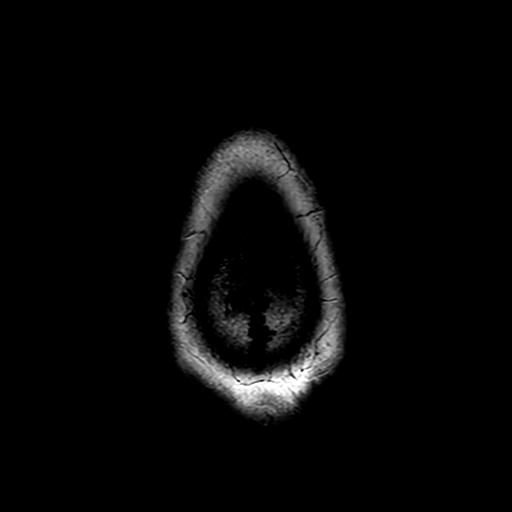

[Series 250: ADC · axial · 3.0mm · 0.94mm/px · z∈[-71,+81]mm · 4 of 52 slices shown (1 of 2)]
[im 1/52]
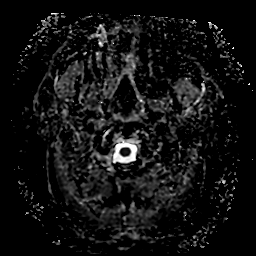
[im 18/52]
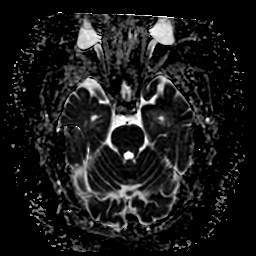
[im 35/52]
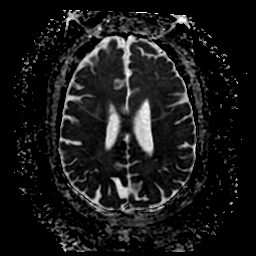
[im 52/52]
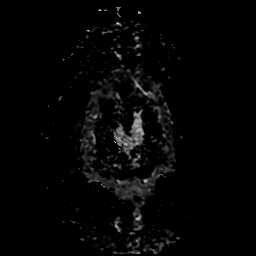

[Series 350: ADC · coronal · 4.0mm · 0.94mm/px · 3 of 37 slices shown (2 of 2)]
[im 1/37]
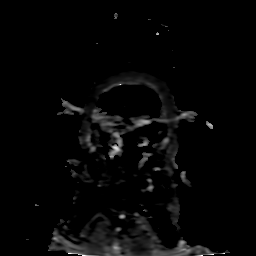
[im 19/37]
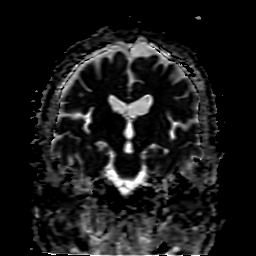
[im 37/37]
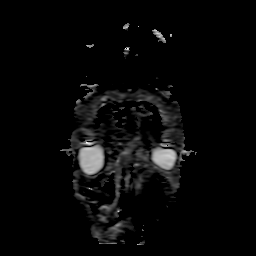

[26 of 48 positions shown; findings below may reference images not displayed]

FINDINGS: Brain: No acute infarction, hemorrhage, hydrocephalus, extra-axial
collection or mass lesion. Mild scattered T2/FLAIR hyperintensities
within the white matter, nonspecific but most likely related to mild
for age chronic microvascular ischemic disease.

Vascular: Major arterial flow voids are maintained at the skull
base.

Skull and upper cervical spine: Normal marrow signal.

Sinuses/Orbits: Left greater than right maxillary sinus mucosal
thickening with air-fluid level in the left maxillary sinus.

Other: No sizable mastoid effusions.
IMPRESSION: 1. No evidence of acute intracranial abnormality. Specifically, no
acute infarct.
2. Left greater than right maxillary sinus mucosal thickening with
air-fluid level.

## 2021-04-20 IMAGING — MR MR CERVICAL SPINE WO/W CM
4 of 8 series · 18 of 48 positions shown · IV contrast (gadavist)
Comparison: None.

CLINICAL DATA: Cervical radiculopathy.

EXAM:
MRI CERVICAL SPINE WITHOUT AND WITH CONTRAST
TECHNIQUE: Multiplanar and multiecho pulse sequences of the cervical spine, to
include the craniocervical junction and cervicothoracic junction,
were obtained without and with intravenous contrast.
CONTRAST:  10mL GADAVIST GADOBUTROL 1 MMOL/ML IV SOLN

[Series 11: T2 · sagittal · 3.0mm · 0.43mm/px · 4 of 16 slices shown (1 of 2)]
[im 1/16]
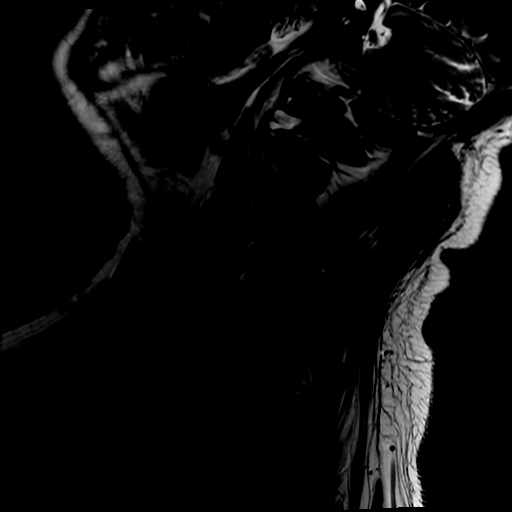
[im 6/16]
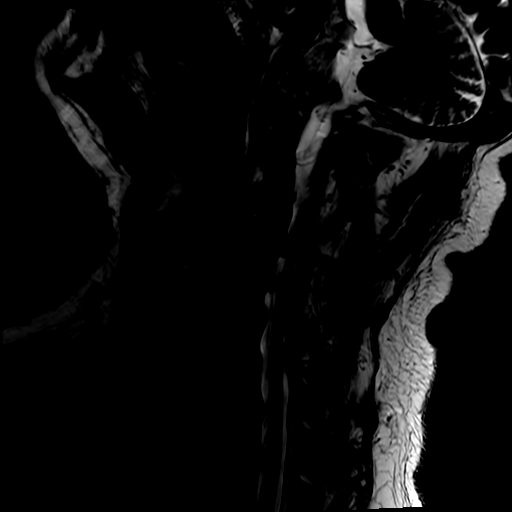
[im 11/16]
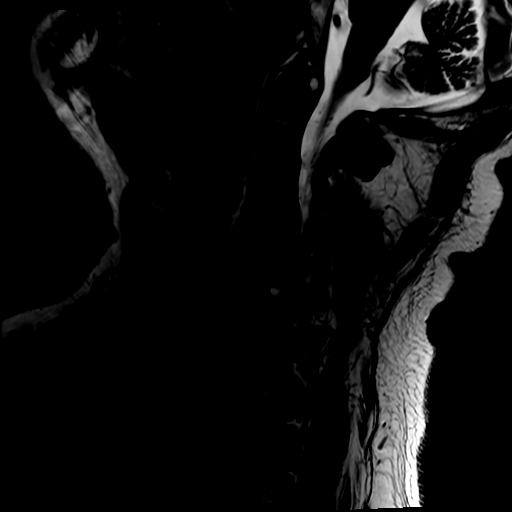
[im 16/16]
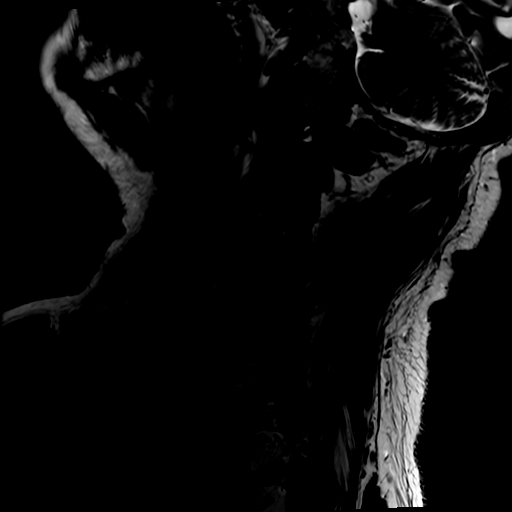

[Series 15: T2 · axial · 3.0mm · 0.35mm/px · z∈[-246,-154]mm · 6 of 32 slices shown (2 of 2)]
[im 1/32]
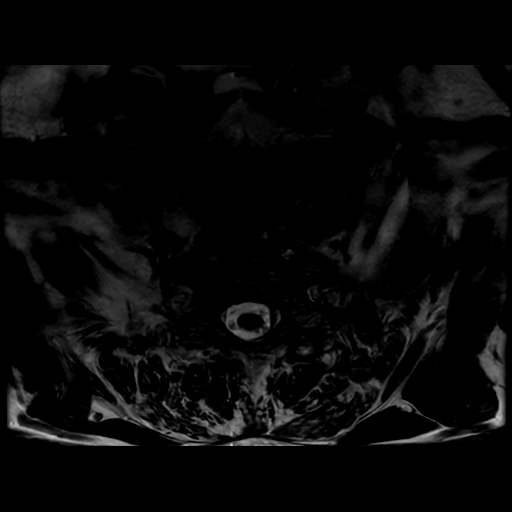
[im 7/32]
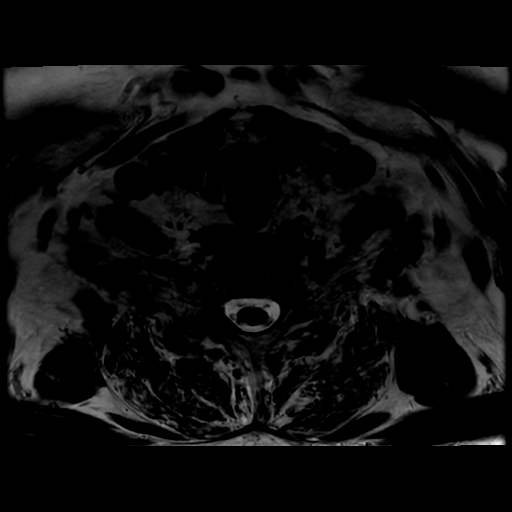
[im 13/32]
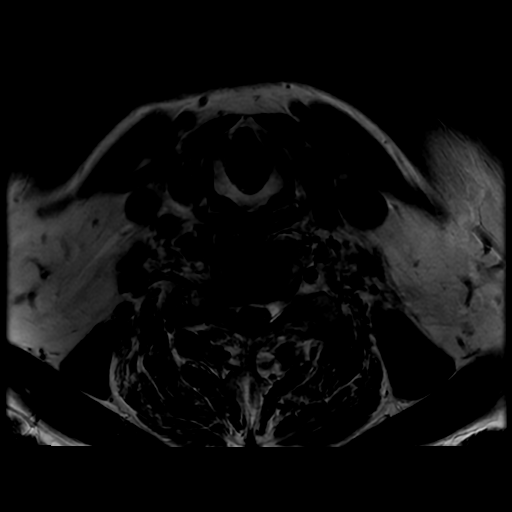
[im 19/32]
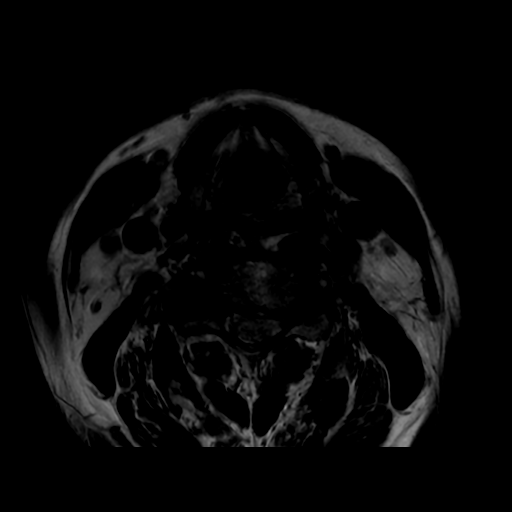
[im 25/32]
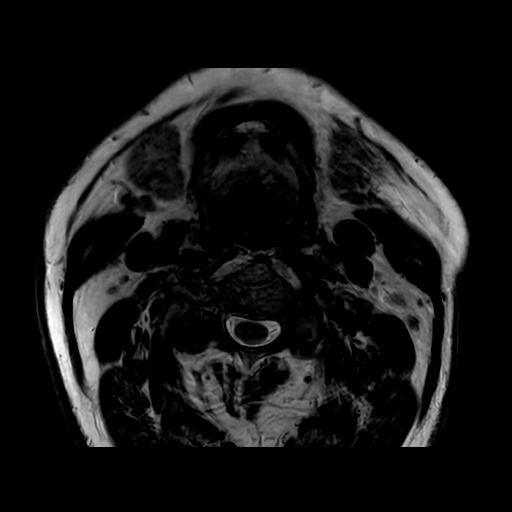
[im 32/32]
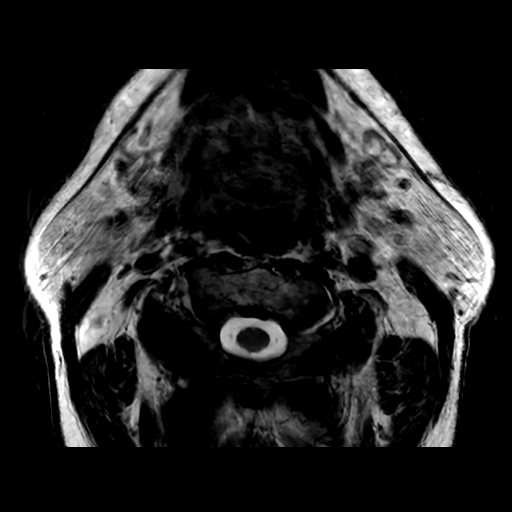

[Series 16: T1 · axial · non-contrast · 3.0mm · 0.35mm/px · z∈[-246,-154]mm · 5 of 32 slices shown]
[im 1/32]
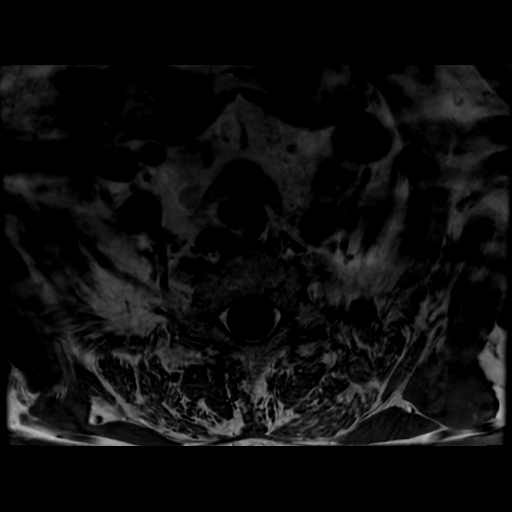
[im 7/32]
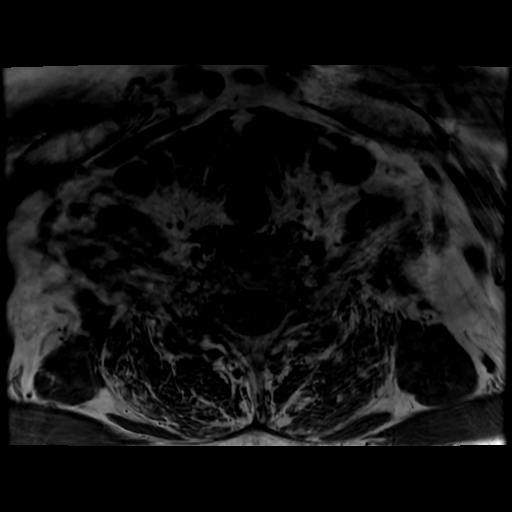
[im 13/32]
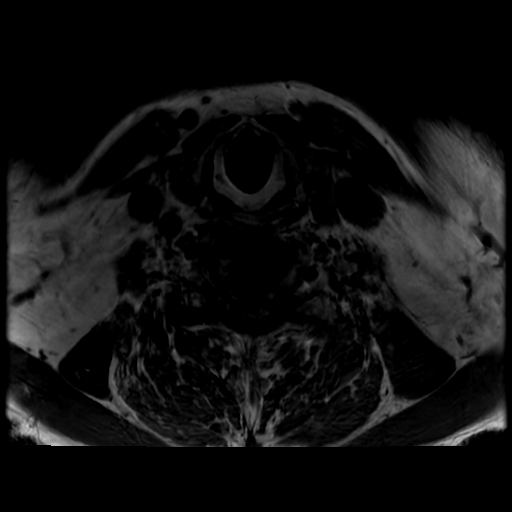
[im 19/32]
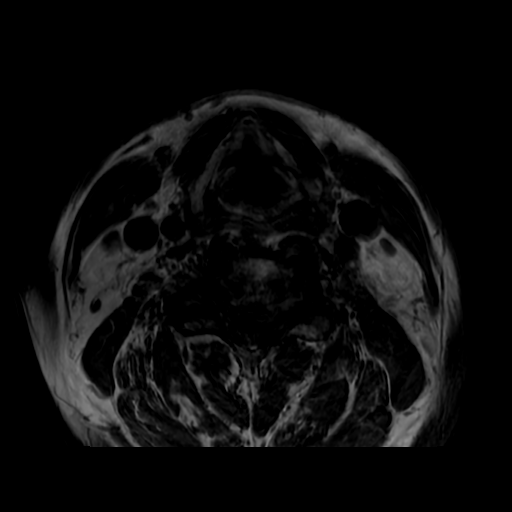
[im 32/32]
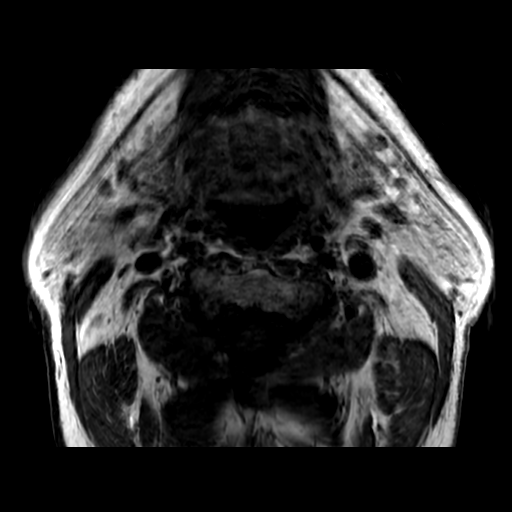

[Series 17: T1 fat-sat post-contrast · sagittal · 3.0mm · 0.43mm/px · 3 of 16 slices shown]
[im 1/16]
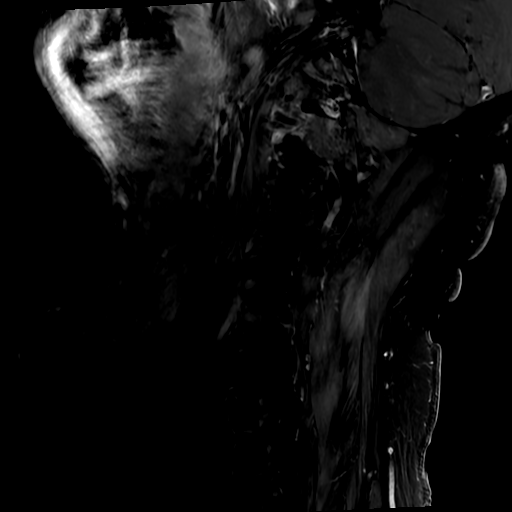
[im 8/16]
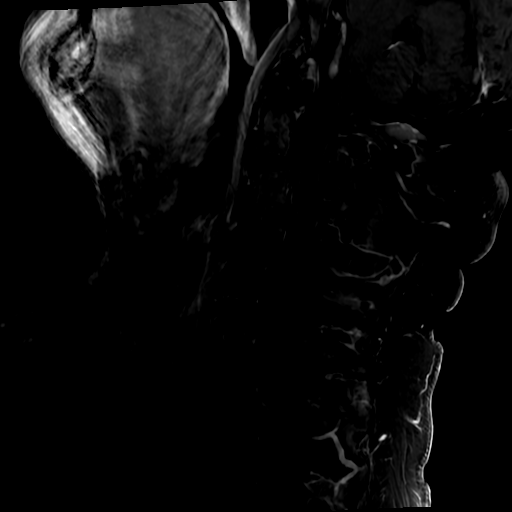
[im 16/16]
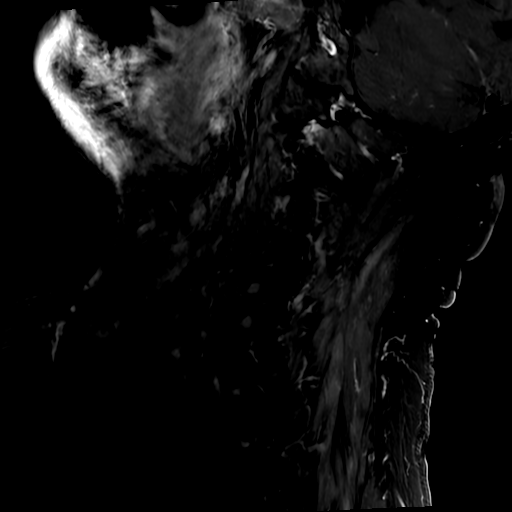

[18 of 48 positions shown; findings below may reference images not displayed]

FINDINGS: Motion limited evaluation.  Within this limitation:

Alignment: Slight retrolisthesis of C3 on C4 and C4 on C5, similar
to prior. Mild reversal of normal cervical lordosis, also similar
prior.

Vertebrae: Vertebral body heights are maintained. No focal marrow
edema to suggest acute fracture or osteomyelitis. No suspicious bone
lesion. Slight edema in the left eccentric C3-C4 and C6-C7 discs is
favored degenerative given left eccentric degenerative disc disease
at these levels and no surrounding bone marrow edema.

Cord: Motion limited evaluation without convincing cord signal
abnormality.

Posterior Fossa, vertebral arteries, paraspinal tissues: Loss of the
normal flow void in the right vertebral artery, concerning for
high-grade stenosis or occlusion. Posterior fossa is better
evaluated on concurrent MRI head.

Disc levels:

C2-C3: Posterior disc osteophyte complex and right greater than left
facet and uncovertebral hypertrophy. Severe right and moderate to
severe left foraminal stenosis. No significant canal stenosis.

C3-C4: Posterior disc osteophyte complex and bilateral facet and
uncovertebral hypertrophy. Moderate to severe bilateral foraminal
stenosis and mild canal stenosis.

C4-C5: Posterior disc osteophyte complex and severe bilateral facet
and uncovertebral hypertrophy. Resulting severe bilateral foraminal
stenosis and moderate canal stenosis.

C5-C6: Right eccentric posterior disc osteophyte complex and severe
right moderate left facet and uncovertebral hypertrophy. Resulting
severe right and moderate left foraminal stenosis. Mild-to-moderate
right eccentric canal stenosis.

C6-C7: Posterior disc osteophyte complex and bulky bilateral facet
and uncovertebral hypertrophy. Resulting moderate to severe left and
moderate right foraminal stenosis. No significant canal stenosis.

C7-T1: Right greater than left facet hypertrophy with mild right
foraminal stenosis. No significant canal stenosis.

Posterior disc bulges in the visualized upper thoracic spine without
high-grade canal stenosis.
IMPRESSION: 1. Severe foraminal stenosis on the right at C2-C3 and C5-C6 and
bilaterally at C4-C5. Moderate to severe foraminal stenosis on the
left at C2-C3 and C6-C7 and bilaterally at C3-C4. Moderate foraminal
stenosis on the left at C5-C6 and the right at C6-C7.
2. Moderate canal stenosis C4-C5. Mild-to-moderate right eccentric
canal stenosis C5-C6 and mild canal stenosis at C3-C4.
3. Loss of the normal flow void in the right V2 vertebral artery,
which may represent high-grade stenosis/occlusion versus artifact
secondary to motion. Recommend CTA to further evaluate.

## 2021-04-20 MED ORDER — GADOBUTROL 1 MMOL/ML IV SOLN
10.0000 mL | Freq: Once | INTRAVENOUS | Status: AC | PRN
  Administered 2021-04-20: 10 mL via INTRAVENOUS

## 2021-04-20 MED ORDER — SODIUM CHLORIDE 0.9 % IV SOLN
1000.0000 mL | INTRAVENOUS | Status: DC
  Administered 2021-04-20: 1000 mL via INTRAVENOUS

## 2021-04-20 MED ORDER — IOHEXOL 350 MG/ML SOLN
75.0000 mL | Freq: Once | INTRAVENOUS | Status: AC | PRN
  Administered 2021-04-20: 75 mL via INTRAVENOUS

## 2021-04-20 MED ORDER — SODIUM CHLORIDE 0.9 % IV BOLUS (SEPSIS)
500.0000 mL | Freq: Once | INTRAVENOUS | Status: AC
  Administered 2021-04-20: 500 mL via INTRAVENOUS

## 2021-04-20 NOTE — Discharge Instructions (Signed)
1.  Try to be very conservative on your use of Xanax.  However, do not discontinue abruptly. 2.  Avoid any other alcohol or drugs. 3.  Make an appointment to follow-up with [facility] as soon as possible.  You need further evaluation for your symptoms. 4.  Follow-up with your neurosurgeon to continue management for pain associated with significant narrowing of passageways for your nerves in your neck. 5.  Return to the emergency department if you develop a fever, weakness numbness or tingling of extremity, recurrence of confusion or other concerning symptoms.
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
## 2021-04-20 NOTE — ED Provider Notes (Signed)
[facility] Provider Note   CSN: [id number] Arrival date & time: 04/20/21  [id number]     History Chief Complaint  Patient presents with  . Altered Mental Status    Noah Pacheco is a 70 y.o. male.  HPI Patient has had several recurrent episodes of mental status change and have been evaluated in the emergency department and also by admission to the hospital [phone] with extensive diagnostic evaluation including MRI and LP.  Patient reports that yesterday evening at about 2 AM he awakened from sleep and was suddenly nauseated.  He reports he went into the bathroom and vomited all over the place.  He reports that at that time then he was lying on the floor and was basically immobilized due to generalized weakness.  He reports he just could not really get himself off the floor.  He felt like gravity was holding him down.  He denies any focal weakness but just generally felt extremely weak.  Denies any focal pain.  He reports his wife called EMS.  He reports he is really not sure what happened.  He denies he is having any active abdominal pain or headache.  He does however point out that he has been dealing with some severe posterior cervical neck pain that is been going on now for weeks to months.  Has been the primary active problem from his perspective.  Reports the pain is in the middle of his neck.  He indicates an area from about C6 up to the mid cervical spine.  He reports that sometimes this will radiate all the way up to the back of his head.  No focal weakness numbness or tingling of the extremities.  He has not developed any fevers or chills since the event that got him hospitalized in the first place.  He did have an LP at that time.  He denies he has had any recent alcohol.  He reports typically he drinks about 2 alcohol beverages per day but due to all of the recent events he has been cutting back and he denies having had any alcohol for at least several  days or more.  He denies any marijuana use.  He reports sometimes he takes marijuana Gummies to help sleep because he has really bad problems sleeping at night.  However he reports he has not had any of them in well over a week.  He denies other drugs of abuse.  He is chronically prescribed Xanax for sleep.  He reports he is trying to cut back on that some persistently has trouble with nighttime sleep.    Past Medical History:  Diagnosis Date  . Anemia   . Anxiety   . CKD (chronic kidney disease)   . Gout   . Hepatic steatosis   . Hyperlipidemia   . Hypertension   . Prediabetes     Patient Active Problem List   Diagnosis Date Noted  . Elevated LFTs   . Acute encephalopathy 03/19/2021  . Heart block   . Acute renal failure (HCC)   . Transaminitis   . Altered mental status 03/18/2021    History reviewed. No pertinent surgical history.     History reviewed. No pertinent family history.  Social History   Tobacco Use  . Smoking status: Never Smoker  . Smokeless tobacco: Never Used  Substance Use Topics  . Alcohol use: Not Currently  . Drug use: Not Currently    Home Medications Prior to Admission medications  Medication Sig Start Date End Date Taking? Authorizing Provider  allopurinol (ZYLOPRIM) 100 MG tablet Take 100 mg by mouth daily. 04/09/21  Yes [provider]  methylPREDNISolone (MEDROL DOSEPAK) 4 MG TBPK tablet Take 4 mg by mouth See admin instructions. 04/07/21  Yes [provider]  allopurinol (ZYLOPRIM) 100 MG tablet Take 100 mg by mouth daily.    [provider]  ALPRAZolam Prudy Feeler) 0.5 MG tablet Take 0.5 mg by mouth at bedtime as needed for anxiety or sleep.    [provider]  amLODipine (NORVASC) 5 MG tablet Take 5 mg by mouth daily. 04/19/21   [provider]  ampicillin (PRINCIPEN) 500 MG capsule Take 500 mg by mouth in the morning and at bedtime.    [provider]  aspirin EC 81 MG tablet Take 1 tablet  (81 mg total) by mouth daily. Swallow whole. 03/20/21 03/20/22  [name], MD  atorvastatin (LIPITOR) 80 MG tablet Take 80 mg by mouth daily.    [provider]  cyclobenzaprine (FLEXERIL) 10 MG tablet Take 10 mg by mouth 3 (three) times daily as needed for muscle spasms.    [provider]  cyclobenzaprine (FLEXERIL) 5 MG tablet Take 5 mg by mouth 3 (three) times daily as needed. 04/07/21   [provider]  doxycycline (VIBRA-TABS) 100 MG tablet Take 1 tablet (100 mg total) by mouth every 12 (twelve) hours. For 12 days (total 14 days) 03/20/21   [name], MD  enalapril (VASOTEC) 20 MG tablet Take 20 mg by mouth daily.    [provider]  hydrochlorothiazide (HYDRODIURIL) 12.5 MG tablet Take 12.5 mg by mouth daily.    [provider]  Multiple Vitamin (MULTIVITAMIN WITH MINERALS) TABS tablet Take 1 tablet by mouth daily.    [provider]  oxyCODONE (OXY IR/ROXICODONE) 5 MG immediate release tablet Take 5 mg by mouth every 6 (six) hours as needed. 04/15/21 04/20/21  [provider]  oxycodone-acetaminophen (PERCOCET) 2.5-325 MG tablet Take 1 tablet by mouth every 4 (four) hours as needed for pain.    [provider]    Allergies    Patient has no known allergies.  Review of Systems   Review of Systems 10 systems reviewed and negative except as per HPI Physical Exam Updated Vital Signs BP (!) 107/55 (BP Location: Right Arm)   Pulse 72   Temp 98.6 F (37 C) (Oral)   Resp 13   Ht  (1.778 m)   Wt 99.8 kg   SpO2 96%   BMI 31.57 kg/m   Physical Exam Constitutional:      Comments: Alert nontoxic.  No respiratory distress.  Mental status clear.  HENT:     Nose: Nose normal.     Mouth/Throat:     Mouth: Mucous membranes are dry.  Eyes:     Extraocular Movements: Extraocular movements intact.     Conjunctiva/sclera: Conjunctivae normal.     Pupils: Pupils are equal, round, and reactive to light.   Cardiovascular:     Rate and Rhythm: Normal rate and regular rhythm.  Pulmonary:     Effort: Pulmonary effort is normal.     Breath sounds: Normal breath sounds.  Abdominal:     General: There is no distension.     Palpations: Abdomen is soft.     Tenderness: There is no abdominal tenderness. There is no guarding.  Musculoskeletal:        General: No swelling or tenderness. Normal  range of motion.     Cervical back: Neck supple.     Right lower leg: No edema.     Left lower leg: No edema.  Skin:    General: Skin is warm and dry.  Neurological:     General: No focal deficit present.     Mental Status: He is oriented to person, place, and time.     Cranial Nerves: No cranial nerve deficit.     Sensory: No sensory deficit.     Motor: No weakness.     Coordination: Coordination normal.     Comments: Patient is alert and oriented x3.  He is giving appropriate history.  Speech is not slurred.  He does seem just slightly glassy eyed.  No obvious intoxication.  No focal motor deficits.  Patient can perform heel shin exam bilaterally.  He does not exhibit any asterixis.  Psychiatric:        Mood and Affect: Mood normal.     ED Results / Procedures / Treatments   Labs (all labs ordered are listed, but only abnormal results are displayed) Labs Reviewed  RAPID URINE DRUG SCREEN, HOSP PERFORMED - Abnormal; Notable for the following components:      Result Value   Benzodiazepines POSITIVE (*)    All other components within normal limits  COMPREHENSIVE METABOLIC PANEL - Abnormal; Notable for the following components:   Potassium 3.2 (*)    Glucose, Bld 122 (*)    BUN 25 (*)    Creatinine, Ser 1.37 (*)    Total Protein 6.4 (*)    GFR, Estimated 55 (*)    All other components within normal limits  ACETAMINOPHEN LEVEL - Abnormal; Notable for the following components:   Acetaminophen (Tylenol), Serum <10 (*)    All other components within normal limits  CBC WITH DIFFERENTIAL/PLATELET -  Abnormal; Notable for the following components:   WBC 12.4 (*)    RBC 3.96 (*)    Neutro Abs 10.1 (*)    All other components within normal limits  GAMMA GT - Abnormal; Notable for the following components:   GGT 90 (*)    All other components within normal limits  TROPONIN I (HIGH SENSITIVITY) - Abnormal; Notable for the following components:   Troponin I (High Sensitivity) 18 (*)    All other components within normal limits  ETHANOL  LIPASE, BLOOD  PROTIME-INR  MAGNESIUM  PHOSPHORUS  AMMONIA  CK  URINALYSIS, ROUTINE W REFLEX MICROSCOPIC  TROPONIN I (HIGH SENSITIVITY)    EKG EKG Interpretation  Date/Time:  Tuesday April 20 2021 08:46:50 EDT Ventricular Rate:  91 PR Interval:  154 QRS Duration: 110 QT Interval:  370 QTC Calculation: 456 R Axis:   27 Text Interpretation: Sinus rhythm no change from yesterday Confirmed by Arby Barrette [id number]) on 04/20/2021 9:00:17 AM   Radiology CT Angio Head W or Wo Contrast  Result Date: 04/20/2021 CLINICAL DATA:  Vertebral artery stenosis or occlusion suspected on MRI. EXAM: CT ANGIOGRAPHY HEAD AND NECK TECHNIQUE: Multidetector CT imaging of the head and neck was performed using the standard protocol during bolus administration of intravenous contrast. Multiplanar CT image reconstructions and MIPs were obtained to evaluate the vascular anatomy. Carotid stenosis measurements (when applicable) are obtained utilizing NASCET criteria, using the distal internal carotid diameter as the denominator. CONTRAST:  10mL OMNIPAQUE IOHEXOL 350 MG/ML SOLN COMPARISON:  Same day MRI. FINDINGS: CT HEAD FINDINGS Brain: No evidence of acute large vascular territory infarction, hemorrhage, hydrocephalus, extra-axial collection or mass  lesion/mass effect. Vascular: See below. Skull: No acute fracture. Sinuses: Scattered ethmoid air cell mucosal thickening and mucosal thickening of bilateral inferior maxillary sinuses and the left sphenoid sinus. Orbits: No acute  finding. Review of the MIP images confirms the above findings CTA NECK FINDINGS Aortic arch: Great vessel origins are patent. Mild narrowing of the right subclavian artery origin. Right carotid system: Mixed calcific and noncalcific atherosclerosis at the carotid bifurcation with approximately 70% stenosis. Left carotid system: Predominantly calcific atherosclerosis at the carotid bifurcation with approximately 20% stenosis. 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 Patent left vertebral artery with approximately 40% stenosis at the origin. Skeleton: Moderate to severe multilevel degenerative disc disease with disc height loss, endplate sclerosis, and posterior disc osteophyte complexes. Retrolisthesis of C4 on C5, likely degenerative given facet arthropathy. Other neck: No acute abnormality. Upper chest: Mild dependent ground-glass opacities likely represent atelectasis. Otherwise, visualized lung apices are clear. Review of the MIP images confirms the above findings CTA HEAD FINDINGS Anterior circulation: No large vessel occlusion or proximal hemodynamically significant stenosis. Tiny (1-2 mm) posteriorly directed outpouching arising from the proximal left cavernous ICA (see series 11, image 115 and series 13, image 113). Posterior circulation: Intradural right vertebral artery narrowing may be due to poor opacification/flow (given proximal occlusion) versus superimposed stenosis. Patent intradural left vertebral artery with mild narrowing. Patent basilar artery and bilateral posterior cerebral arteries without proximal hemodynamically significant stenosis or visible aneurysm. Venous sinuses: As permitted by contrast timing, patent. Small bilateral transverse and sigmoid sinuses. Review of the MIP images confirms the above findings IMPRESSION: CTA neck: 1. 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 2. Bilateral carotid bifurcation atherosclerosis with approximately 70% stenosis on the right and 20% stenosis on the left. 3. Approximately 40% left vertebral artery origin stenosis. CTA head: 1. No large vessel occlusion intracranially. 2. Intradural right vertebral artery narrowing may be due to poor opacification/flow (given proximal occlusion) versus superimposed stenosis. Otherwise, no hemodynamically significant proximal stenosis intracranial. 3. 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 CT head: 1. No evidence of acute intracranial abnormality. 2. Paranasal sinus mucosal thickening. Electronically Signed   By: Feliberto Harts MD   On: 04/20/2021 16:07   CT Angio Neck W and/or Wo Contrast  Result Date: 04/20/2021 CLINICAL DATA:  Vertebral artery stenosis or occlusion suspected on MRI. EXAM: CT ANGIOGRAPHY HEAD AND NECK TECHNIQUE: Multidetector CT imaging of the head and neck was performed using the standard protocol during bolus administration of intravenous contrast. Multiplanar CT image reconstructions and MIPs were obtained to evaluate the vascular anatomy. Carotid stenosis measurements (when applicable) are obtained utilizing NASCET criteria, using the distal internal carotid diameter as the denominator. CONTRAST:  58mL OMNIPAQUE IOHEXOL 350 MG/ML SOLN COMPARISON:  Same day MRI. FINDINGS: CT HEAD FINDINGS Brain: No evidence of acute large vascular territory infarction, hemorrhage, hydrocephalus, extra-axial collection or mass lesion/mass effect. Vascular: See below. Skull: No acute fracture. Sinuses: Scattered ethmoid air cell mucosal thickening and mucosal thickening of bilateral inferior maxillary sinuses and the left sphenoid sinus. Orbits: No acute finding. Review of the MIP images confirms the above findings CTA NECK  FINDINGS Aortic arch: Great vessel origins are patent. Mild narrowing of the right subclavian artery origin. Right carotid system: Mixed calcific and noncalcific atherosclerosis at the carotid bifurcation with approximately  70% stenosis. Left carotid system: Predominantly calcific atherosclerosis at the carotid bifurcation with approximately 20% stenosis. 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 Patent left vertebral artery with approximately 40% stenosis at the origin. Skeleton: Moderate to severe multilevel degenerative disc disease with disc height loss, endplate sclerosis, and posterior disc osteophyte complexes. Retrolisthesis of C4 on C5, likely degenerative given facet arthropathy. Other neck: No acute abnormality. Upper chest: Mild dependent ground-glass opacities likely represent atelectasis. Otherwise, visualized lung apices are clear. Review of the MIP images confirms the above findings CTA HEAD FINDINGS Anterior circulation: No large vessel occlusion or proximal hemodynamically significant stenosis. Tiny (1-2 mm) posteriorly directed outpouching arising from the proximal left cavernous ICA (see series 11, image 115 and series 13, image 113). Posterior circulation: Intradural right vertebral artery narrowing may be due to poor opacification/flow (given proximal occlusion) versus superimposed stenosis. Patent intradural left vertebral artery with mild narrowing. Patent basilar artery and bilateral posterior cerebral arteries without proximal hemodynamically significant stenosis or visible aneurysm. Venous sinuses: As permitted by contrast timing, patent. Small bilateral transverse and sigmoid sinuses. Review of the MIP images confirms the above findings IMPRESSION: CTA neck: 1. 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 2. Bilateral carotid bifurcation atherosclerosis with approximately 70% stenosis on the right and 20% stenosis on the left. 3. Approximately 40% left vertebral artery origin stenosis. CTA head: 1. No large vessel occlusion intracranially. 2. Intradural right vertebral artery narrowing may be due to poor opacification/flow (given proximal occlusion) versus superimposed stenosis. Otherwise, no hemodynamically significant proximal stenosis intracranial. 3. 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 CT head: 1. No evidence of acute intracranial abnormality. 2. Paranasal sinus mucosal thickening. Electronically Signed   By: Feliberto Harts MD   On: 04/20/2021 16:07   MR Brain W and Wo Contrast  Result Date: 04/20/2021 CLINICAL DATA:  Neuro deficit, acute stroke suspected. EXAM: MRI HEAD WITHOUT AND WITH CONTRAST TECHNIQUE: Multiplanar, multiecho pulse sequences of the brain and surrounding structures were obtained without and with intravenous contrast. CONTRAST:  17mL GADAVIST GADOBUTROL 1 MMOL/ML IV SOLN COMPARISON:  MRI head Mar 18, 2021. FINDINGS: Brain: No acute infarction, hemorrhage, hydrocephalus, extra-axial collection or mass lesion. Mild scattered T2/FLAIR hyperintensities within the white matter, nonspecific but most likely related to mild for age chronic microvascular ischemic disease. Vascular: Major arterial flow voids are maintained at the skull base. Skull and upper cervical spine: Normal marrow signal. Sinuses/Orbits: Left greater than right maxillary sinus mucosal thickening with air-fluid level in the left maxillary sinus. Other: No sizable mastoid effusions. IMPRESSION: 1. No evidence of acute intracranial abnormality. Specifically, no acute infarct. 2. Left greater than right maxillary sinus mucosal thickening with air-fluid level. Electronically Signed   By: Feliberto Harts MD   On:  04/20/2021 12:04   MR Cervical Spine W or Wo Contrast  Result Date: 04/20/2021 CLINICAL DATA:  Cervical radiculopathy. EXAM: MRI CERVICAL SPINE WITHOUT AND WITH CONTRAST TECHNIQUE: Multiplanar and multiecho pulse sequences of the cervical spine, to include the craniocervical junction and cervicothoracic junction, were obtained without and with intravenous contrast. CONTRAST:  46mL GADAVIST GADOBUTROL 1 MMOL/ML IV SOLN COMPARISON:  None. FINDINGS: Motion limited evaluation.  Within this limitation: Alignment: Slight retrolisthesis of C3 on C4 and C4 on C5, similar to prior. Mild reversal of normal cervical lordosis,  also similar prior. Vertebrae: Vertebral body heights are maintained. No focal marrow edema to suggest acute fracture or osteomyelitis. No suspicious bone lesion. Slight edema in the left eccentric C3-C4 and C6-C7 discs is favored degenerative given left eccentric degenerative disc disease at these levels and no surrounding bone marrow edema. Cord: Motion limited evaluation without convincing cord signal abnormality. Posterior Fossa, vertebral arteries, paraspinal tissues: Loss of the normal flow void in the right vertebral artery, concerning for high-grade stenosis or occlusion. Posterior fossa is better evaluated on concurrent MRI head. Disc levels: C2-C3: Posterior disc osteophyte complex and right greater than left facet and uncovertebral hypertrophy. Severe right and moderate to severe left foraminal stenosis. No significant canal stenosis. C3-C4: Posterior disc osteophyte complex and bilateral facet and uncovertebral hypertrophy. Moderate to severe bilateral foraminal stenosis and mild canal stenosis. C4-C5: Posterior disc osteophyte complex and severe bilateral facet and uncovertebral hypertrophy. Resulting severe bilateral foraminal stenosis and moderate canal stenosis. C5-C6: Right eccentric posterior disc osteophyte complex and severe right moderate left facet and uncovertebral hypertrophy.  Resulting severe right and moderate left foraminal stenosis. Mild-to-moderate right eccentric canal stenosis. C6-C7: Posterior disc osteophyte complex and bulky bilateral facet and uncovertebral hypertrophy. Resulting moderate to severe left and moderate right foraminal stenosis. No significant canal stenosis. C7-T1: Right greater than left facet hypertrophy with mild right foraminal stenosis. No significant canal stenosis. Posterior disc bulges in the visualized upper thoracic spine without high-grade canal stenosis. IMPRESSION: 1. Severe foraminal stenosis on the right at C2-C3 and C5-C6 and bilaterally at C4-C5. Moderate to severe foraminal stenosis on the left at C2-C3 and C6-C7 and bilaterally at C3-C4. Moderate foraminal stenosis on the left at C5-C6 and the right at C6-C7. 2. Moderate canal stenosis C4-C5. Mild-to-moderate right eccentric canal stenosis C5-C6 and mild canal stenosis at C3-C4. 3. Loss of the normal flow void in the right V2 vertebral artery, which may represent high-grade stenosis/occlusion versus artifact secondary to motion. Recommend CTA to further evaluate. Electronically Signed   By: Feliberto Harts MD   On: 04/20/2021 12:20   CT HEAD CODE STROKE WO CONTRAST  Result Date: 04/19/2021 CLINICAL DATA:  Code stroke. Initial evaluation for neuro deficit, stroke suspected. Confusion. EXAM: CT HEAD WITHOUT CONTRAST TECHNIQUE: Contiguous axial images were obtained from the base of the skull through the vertex without intravenous contrast. COMPARISON:  Previous MRI from 03/18/2021 FINDINGS: Brain: Age-related cerebral atrophy. Recently identified punctate left parietal infarct not visible by CT. No other acute large vessel territory infarct. No acute intracranial hemorrhage. No mass lesion, mass effect or midline shift. No hydrocephalus or extra-axial fluid collection. Vascular: No hyperdense vessel. Skull: Scalp soft tissues and calvarium within normal limits. Sinuses/Orbits: Globes and  orbital soft tissues normal. Chronic maxillary sinusitis noted, left worse than right. No mastoid effusion. Other: None. ASPECTS [facility] Stroke Program Early CT Score) - Ganglionic level infarction (caudate, lentiform nuclei, internal capsule, insula, M1-M3 cortex): 7 - Supraganglionic infarction (M4-M6 cortex): 3 Total score (0-10 with 10 being normal): 10 IMPRESSION: 1. No acute intracranial infarct or other abnormality. 2. ASPECTS is 10. These results were communicated to Dr. Derry Lory At 9:37 pmon 6/6/2022by text page via the [facility] messaging system. Electronically Signed   By: Rise Mu M.D.   On: 04/19/2021 21:39    Procedures Procedures   Medications Ordered in ED Medications  sodium chloride 0.9 % bolus 500 mL (0 mLs Intravenous Stopped 04/20/21 1619)    Followed by  0.9 %  sodium chloride infusion (1,000 mLs Intravenous  New Bag/Given 04/20/21 1624)  gadobutrol (GADAVIST) 1 MMOL/ML injection 10 mL (10 mLs Intravenous Contrast Given 04/20/21 1136)  iohexol (OMNIPAQUE) 350 MG/ML injection 75 mL (75 mLs Intravenous Contrast Given 04/20/21 1538)    ED Course  I have reviewed the triage vital signs and the nursing notes.  Pertinent labs & imaging results that were available during my care of the patient were reviewed by me and considered in my medical decision making (see chart for details).  Clinical Course as of 04/20/21 1657  Tue Apr 20, 2021  1613 Consult: Reviewed with Dr. Jake Samples neurosurgery.  MRI does not show significant canal stenosis that could account for general weakness.  From perspective of MRI findings and cervical spine patient will be stable for outpatient follow-up.  If patient is admitted, Dr. Jake Samples will do inpatient consultation. [MP]    Clinical Course User Index [MP] Arby Barrette, MD  Consult: Reviewed Dr. Selina Cooley neurology.  She has reviewed results of MRI and CT.  No findings at this time to account for patient's symptoms.  Findings are chronic and  appropriate for outpatient follow-up with neurology. MDM Rules/Calculators/A&P                          Patient presents as outlined.  He does have a nonfocal neurologic exam without evidence of acute stroke.  No code stroke initiated.  Review of prior presentation does show MRI with possible older or subacute infarct.  We will proceed with repeat MRI.  Also, at that time suspicion of oversedation due to medications.  Patient denies use of opiates or alcohol.  He does continue to use his Xanax as needed.  He denies any recent use of marijuana.  Patient's mental status is clear at the time of my evaluation.  MRI does not show any stroke findings.  However concern for possible vertebral artery stenosis.  Recommendations for CT angiogram.  Patient does complain of a lot of neck pain this is been an ongoing issue.  There are multiple levels of foraminal stenos and mild to moderate canal stenosis at C3 c4stenosis consistent with cervical pain described by the patient.  On exam he does not have any focal motor deficit.  At this time, patient is alert and appropriate.  Vital signs are stable.  CT and MRI show findings that are reviewed with neurosurgery and neurology.  Chronic findings do not explain patient's acute symptoms of generalized weakness earlier in the morning.  Troponins are flat.  No sign of an acute ischemic event.  Patient did not experience chest pain or shortness of breath.  No signs of acute infectious illness.  Patient had extensive evaluation at prior hospitalization including LP without explanatory cause.  At this time plan will be for close follow-up with neurology on outpatient basis and careful return precautions reviewed.  Patient reports he does have a neurosurgeon whom he sees for his neck condition.  He will follow-up with his neurosurgeon. Final Clinical Impression(s) / ED Diagnoses Final diagnoses:  Altered mental status, unspecified altered mental status type  Generalized weakness     Rx / DC Orders ED Discharge Orders         Ordered    Ambulatory referral to Neurology       Comments: An appointment is requested in approximately: 1 week   04/20/21 1654           Arby Barrette, MD 04/20/21 1700

## 2021-04-20 NOTE — ED Notes (Signed)
Pt to MRI 1010

## 2021-04-22 ENCOUNTER — Encounter: Payer: Self-pay | Admitting: Neurology

## 2021-04-22 ENCOUNTER — Ambulatory Visit (INDEPENDENT_AMBULATORY_CARE_PROVIDER_SITE_OTHER): Payer: Medicare Other | Admitting: Neurology

## 2021-04-22 VITALS — BP 130/68 | HR 61 | Ht 70.0 in | Wt 229.0 lb

## 2021-04-22 DIAGNOSIS — R410 Disorientation, unspecified: Secondary | ICD-10-CM

## 2021-04-22 NOTE — Progress Notes (Signed)
Reason for visit: Altered mental status  Referring physician: McIntosh  Noah Pacheco is a 70 y.o. male  History of present illness:  Noah Pacheco is a 70 year old right-handed white male with a history of chronic neck discomfort, and a history of prediabetes.  He has evidence through prior neurologic work-up of a peripheral neuropathy, this causes a lot of discomfort in his feet at night making it difficult for him to sleep.  The patient takes 1 mg of alprazolam at night.  He is on Flexeril 5 mg tablets if needed for his neck.  The patient was admitted to the hospital on 18 Mar 2021 with some altered mental status.  The patient had not been acting properly for about 2 and half days prior to the mission, he was putting his dogs in the car to take them to school, and otherwise acting strangely.  The patient had been just started on amitriptyline, but he was only taking 10 mg at night.  The patient went into the hospital with a possible delirium, a urine drug screen was positive for THC, opiates, and benzodiazepines.  The patient admitted to taking THC Gummies.  He also had a bottle of opiate medications that he was taking, his wife was not aware of this.  Evaluation in the hospital included a lumbar puncture that was unremarkable, but MRI of the brain showed a punctate acute stroke in the deep parietal white matter, but at that time, no other vascular work-up was done.  The patient was noted to have significant elevation in BUN and creatinine, and liver enzyme elevations were significant.  The patient does have a history of alcohol intake, usually consuming two alcoholic beverages at night.  The patient denies that he was drinking heavily prior to that admission.  The patient has done well for about a month, but then on 18 April 2021, he went into the emergency room after being found in a gas station parking lot to be confused.  He was noted to have pinpoint pupils, he was given Narcan with improvement of  mental status.  Once again, his urine drug screen was positive for opiate medications, he was given a prescription for Percocet 3 days earlier.  He admitted to taking 3 of the 5 mg Percocet tablets that morning.  He returned to the emergency room on 6 June with confusion once again.  He was asked to stop the opiate medications at that point.  The patient returned on 20 April 2021 with an episode of nausea and vomiting in the bathroom at home, associated with generalized weakness.  The urine drug screen at that time was negative for opiate medications.  The patient claims that he has stopped the amitriptyline, and he has stopped opiate medications.  His BUN and creatinine and liver enzymes have improved.  He is no longer taking the THC gummies.  MRI evaluation in the emergency room at that time showed no acute stroke by MRI brain, but CT angiogram revealed 70% stenosis of the right carotid artery, 20% on the left.  The patient has evidence of right vertebral artery disease with some muscular branches that help reconstitute the blood vessel.  He is sent here for further evaluation.  The patient is on aspirin therapy at this time.  Past Medical History:  Diagnosis Date   Anemia    Anxiety    CKD (chronic kidney disease)    Gout    Hepatic steatosis    Hyperlipidemia  Hypertension    Prediabetes     History reviewed. No pertinent surgical history.  History reviewed. No pertinent family history.  Social history:  reports that he has never smoked. He has never used smokeless tobacco. He reports previous alcohol use. He reports previous drug use.  Medications:  Prior to Admission medications   Medication Sig Start Date End Date Taking? Authorizing Provider  allopurinol (ZYLOPRIM) 100 MG tablet Take 100 mg by mouth daily.   Yes [provider]  allopurinol (ZYLOPRIM) 100 MG tablet Take 100 mg by mouth daily. 04/09/21  Yes [provider]  ALPRAZolam Prudy Feeler(XANAX) 0.5 MG tablet Take 0.5 mg  by mouth at bedtime as needed for anxiety or sleep.   Yes [provider]  amLODipine (NORVASC) 5 MG tablet Take 5 mg by mouth daily. 04/19/21  Yes [provider]  ampicillin (PRINCIPEN) 500 MG capsule Take 500 mg by mouth in the morning and at bedtime.   Yes [provider]  aspirin EC 81 MG tablet Take 1 tablet (81 mg total) by mouth daily. Swallow whole. 03/20/21 03/20/22 Yes Inez CatalinaMullen, Emily B, MD  atorvastatin (LIPITOR) 80 MG tablet Take 80 mg by mouth daily.   Yes [provider]  cyclobenzaprine (FLEXERIL) 10 MG tablet Take 10 mg by mouth 3 (three) times daily as needed for muscle spasms.   Yes [provider]  cyclobenzaprine (FLEXERIL) 5 MG tablet Take 5 mg by mouth 3 (three) times daily as needed. 04/07/21  Yes [provider]  doxycycline (VIBRA-TABS) 100 MG tablet Take 1 tablet (100 mg total) by mouth every 12 (twelve) hours. For 12 days (total 14 days) 03/20/21  Yes Inez CatalinaMullen, Emily B, MD  enalapril (VASOTEC) 20 MG tablet Take 20 mg by mouth daily.   Yes [provider]  hydrochlorothiazide (HYDRODIURIL) 12.5 MG tablet Take 12.5 mg by mouth daily.   Yes [provider]  methylPREDNISolone (MEDROL DOSEPAK) 4 MG TBPK tablet Take 4 mg by mouth See admin instructions. 04/07/21  Yes [provider]  Multiple Vitamin (MULTIVITAMIN WITH MINERALS) TABS tablet Take 1 tablet by mouth daily.   Yes [provider]  oxycodone-acetaminophen (PERCOCET) 2.5-325 MG tablet Take 1 tablet by mouth every 4 (four) hours as needed for pain.   Yes [provider]     No Known Allergies  ROS:  Out of a complete 14 system review of symptoms, the patient complains only of the following symptoms, and all other reviewed systems are negative.  Episodic confusion Numbness in the feet Chronic neck pain  Blood pressure 130/68, pulse 61, height 5\' 10"  (1.778 m), weight 229 lb (103.9 kg).  Physical Exam  General: The patient  is alert and cooperative at the time of the examination.  The patient is moderately obese.  Eyes: Pupils are equal, round, and reactive to light. Discs are flat bilaterally.  Neck: The neck is supple, no carotid bruits are noted.  Respiratory: The respiratory examination is clear.  Cardiovascular: The cardiovascular examination reveals a regular rate and rhythm, no obvious murmurs or rubs are noted.  Skin: Extremities are without significant edema.  Neurologic Exam  Mental status: The patient is alert and oriented x 3 at the time of the examination. The patient has apparent normal recent and remote memory, with an apparently normal attention span and concentration ability.  Cranial nerves: Facial symmetry is present. There is good sensation of the face to pinprick and soft touch bilaterally. The strength of the facial muscles and  the muscles to head turning and shoulder shrug are normal bilaterally. Speech is well enunciated, no aphasia or dysarthria is noted. Extraocular movements are full. Visual fields are full. The tongue is midline, and the patient has symmetric elevation of the soft palate. No obvious hearing deficits are noted.  Motor: The motor testing reveals 5 over 5 strength of all 4 extremities. Good symmetric motor tone is noted throughout.  Sensory: Sensory testing is intact to pinprick, soft touch, vibration sensation, and position sense on all 4 extremities, with exception of a stocking pattern pinprick sensory deficit across the ankles bilaterally, some mild impairment of vibration sensation is noted in both feet. No evidence of extinction is noted.  Coordination: Cerebellar testing reveals good finger-nose-finger and heel-to-shin bilaterally.  Gait and station: Gait is normal. Tandem gait is slightly unsteady. Romberg is negative. No drift is seen.  Reflexes: Deep tendon reflexes are symmetric, but are depressed bilaterally. Toes are downgoing bilaterally.   MRI  cervical 04/20/21:  IMPRESSION: 1. Severe foraminal stenosis on the right at C2-C3 and C5-C6 and bilaterally at C4-C5. Moderate to severe foraminal stenosis on the left at C2-C3 and C6-C7 and bilaterally at C3-C4. Moderate foraminal stenosis on the left at C5-C6 and the right at C6-C7. 2. Moderate canal stenosis C4-C5. Mild-to-moderate right eccentric canal stenosis C5-C6 and mild canal stenosis at C3-C4. 3. Loss of the normal flow void in the right V2 vertebral artery, which may represent high-grade stenosis/occlusion versus artifact secondary to motion. Recommend CTA to further evaluate.   MRI brain 04/20/21:  IMPRESSION: 1. No evidence of acute intracranial abnormality. Specifically, no acute infarct. 2. Left greater than right maxillary sinus mucosal thickening with air-fluid level.   CTA head and neck 04/20/21:  IMPRESSION: CTA neck:   1. 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 2. Bilateral carotid bifurcation atherosclerosis with approximately 70% stenosis on the right and 20% stenosis on the left. 3. Approximately 40% left vertebral artery origin stenosis.   CTA head:   1. No large vessel occlusion intracranially. 2. Intradural right vertebral artery narrowing may be due to poor opacification/flow (given proximal occlusion) versus superimposed stenosis. Otherwise, no hemodynamically significant proximal stenosis intracranial. 3. 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   MRI brain 03/18/21:  IMPRESSION: Punctate focus of acute to subacute infarction in the left parietal periventricular white matter. No hemorrhage or mass.  * MRI scan images were reviewed online. I agree with the written report.      Assessment/Plan:  1.  Episodic altered mental status  2.   Peripheral neuropathy  3.  Right carotid artery stenosis  The patient has a prior history of opiate addiction.  Prior to the Mar 18, 2021 admission, he was apparently was taking opiate medications that he had on hand, but these were not prescribed to him.  He also has alprazolam, and was on Flexeril and amitriptyline which are strongly anticholinergic drugs.  I suspect that the etiology of many of his problems with altered mental status is related to overuse of various medications in combination.  He now is off of the opiate medications, and amitriptyline, I have asked him to limit his use of Flexeril.  He will need to ensure that he takes no more than 1 mg of alprazolam at night.  I will set him up for an EEG evaluation.  His renal  function and liver profile have improved over time.  He will follow-up here in 3 months.  Given the carotid artery stenosis, we will check another carotid Doppler study in 6 months.  He will remain on aspirin.  Marlan Palau MD 04/22/2021 6:42 PM  [facility] [address] [place], [place] [address]  Phone [phone] Fax [phone]

## 2021-04-28 ENCOUNTER — Telehealth: Payer: Self-pay | Admitting: Neurology

## 2021-04-28 ENCOUNTER — Ambulatory Visit (INDEPENDENT_AMBULATORY_CARE_PROVIDER_SITE_OTHER): Payer: Medicare Other | Admitting: Neurology

## 2021-04-28 DIAGNOSIS — R410 Disorientation, unspecified: Secondary | ICD-10-CM

## 2021-04-28 NOTE — Telephone Encounter (Signed)
I called the patient.  The EEG study was normal. 

## 2021-04-28 NOTE — Procedures (Signed)
    History:  Noah Pacheco is a 70 year old gentleman with a history of multiple ER visits for altered mental status.  The patient has had prescription and nonprescription opiate use, THC, and benzodiazepine use.  The patient is being evaluated for the episodes of altered mental status.  This is a routine EEG.  No skull defects are noted.  Medications include Flexeril, allopurinol, Norvasc, aspirin, Xanax, hydrochlorothiazide, enalapril, Lipitor, and multivitamins.  EEG classification: Normal awake  Description of the recording: The background rhythms of this recording consists of a fairly well modulated medium amplitude alpha rhythm of 9 Hz that is reactive to eye opening and closure. As the record progresses, the patient appears to remain in the waking state throughout the recording. Photic stimulation was performed, resulting in a bilateral and symmetric photic driving response. Hyperventilation was also performed, resulting in a minimal buildup of the background rhythm activities without significant slowing seen. At no time during the recording does there appear to be evidence of spike or spike wave discharges or evidence of focal slowing. EKG monitor shows no evidence of cardiac rhythm abnormalities with a heart rate of 96.  Impression: This is a normal EEG recording in the waking state. No evidence of ictal or interictal discharges are seen.

## 2021-08-09 ENCOUNTER — Other Ambulatory Visit: Payer: Self-pay

## 2021-08-09 ENCOUNTER — Ambulatory Visit (INDEPENDENT_AMBULATORY_CARE_PROVIDER_SITE_OTHER): Payer: Medicare Other | Admitting: Neurology

## 2021-08-09 ENCOUNTER — Encounter: Payer: Self-pay | Admitting: Neurology

## 2021-08-09 DIAGNOSIS — I6521 Occlusion and stenosis of right carotid artery: Secondary | ICD-10-CM

## 2021-08-09 HISTORY — DX: Occlusion and stenosis of right carotid artery: I65.21

## 2021-08-09 NOTE — Progress Notes (Signed)
Reason for visit: Carotid artery disease  Noah Pacheco is an 70 y.o. male  History of present illness:  Noah Pacheco is a 70 year old right-handed white male with a history of an episode of altered mental status, seen through this office in June.  The patient has been taking multiple pills and THC Gummies and opiate drugs that led to the episodes of altered mental status.  MRI of the brain done in May 2022 showed a punctate acute stroke in the left parietal area, CT angiogram showed 70% stenosis of the left internal carotid artery.  The patient has not had any TIA or strokelike events.  He has not had any further episodes of altered mental status.  He returns to the office today for an evaluation.  Past Medical History:  Diagnosis Date   Anemia    Anxiety    CKD (chronic kidney disease)    Gout    Hepatic steatosis    Hyperlipidemia    Hypertension    Prediabetes     History reviewed. No pertinent surgical history.  History reviewed. No pertinent family history.  Social history:  reports that he has never smoked. He has never used smokeless tobacco. He reports that he does not currently use alcohol. He reports that he does not currently use drugs.    Allergies  Allergen Reactions   Oxycodone     Other reaction(s): Mental Status Changes (intolerance) Causes sedation    Medications:  Prior to Admission medications   Medication Sig Start Date End Date Taking? Authorizing Provider  allopurinol (ZYLOPRIM) 100 MG tablet Take 100 mg by mouth daily. 04/09/21  Yes [provider]  ALPRAZolam Prudy Feeler) 0.5 MG tablet Take 0.5 mg by mouth at bedtime as needed for anxiety or sleep.   Yes [provider]  amLODipine (NORVASC) 5 MG tablet Take 5 mg by mouth daily. 04/19/21  Yes [provider]  ampicillin (PRINCIPEN) 500 MG capsule Take 500 mg by mouth in the morning and at bedtime.   Yes [provider]  aspirin EC 81 MG tablet Take 1 tablet (81 mg  total) by mouth daily. Swallow whole. 03/20/21 03/20/22 Yes [name], MD  atorvastatin (LIPITOR) 80 MG tablet Take 80 mg by mouth daily.   Yes [provider]  Buprenorphine HCl-Naloxone HCl 4-1 MG FILM Place 0.5 strips under the tongue 2 (two) times daily. 07/16/21  Yes [provider]  cyclobenzaprine (FLEXERIL) 5 MG tablet Take 5 mg by mouth 3 (three) times daily as needed. 04/07/21  Yes [provider]  enalapril (VASOTEC) 20 MG tablet Take 20 mg by mouth daily.   Yes [provider]  hydrochlorothiazide (HYDRODIURIL) 12.5 MG tablet Take 12.5 mg by mouth daily.   Yes [provider]  mirtazapine (REMERON) 15 MG tablet Take by mouth. 07/21/21 09/01/21 Yes [provider]  Multiple Vitamin (MULTIVITAMIN WITH MINERALS) TABS tablet Take 1 tablet by mouth daily.   Yes [provider]    ROS:  Out of a complete 14 system review of symptoms, the patient complains only of the following symptoms, and all other reviewed systems are negative.  History of altered mental status  Blood pressure 114/63, pulse 82, height 5\' 10"  (1.778 m), weight 250 lb 9.6 oz (113.7 kg).  Physical Exam  General: The patient is alert and cooperative at the time of the examination.  The patient is moderately obese.  Skin: 1-2+ edema below the knees is seen bilaterally..   Neurologic  Exam  Mental status: The patient is alert and oriented x 3 at the time of the examination. The patient has apparent normal recent and remote memory, with an apparently normal attention span and concentration ability.   Cranial nerves: Facial symmetry is present. Speech is normal, no aphasia or dysarthria is noted. Extraocular movements are full. Visual fields are full.  Motor: The patient has good strength in all 4 extremities.  Sensory examination: Soft touch sensation is symmetric on the face, arms, and legs.  Coordination: The patient has good finger-nose-finger and  heel-to-shin bilaterally.  Gait and station: The patient has a normal gait. Tandem gait is normal. Romberg is negative. No drift is seen.  Reflexes: Deep tendon reflexes are symmetric.   Assessment/Plan:  1.  Cerebrovascular disease, right carotid stenosis  2.  History of altered mental status associated with substance abuse  The patient will have a carotid Doppler study checked in December 2022, we will check the Dopplers annually at that point.  Otherwise, the patient may follow-up here as needed.  Marlan Palau MD 08/09/2021 4:37 PM  [facility] [address] [place], [place] [address]  Phone [phone] Fax [phone]

## 2021-10-19 ENCOUNTER — Telehealth: Payer: Self-pay | Admitting: Diagnostic Neuroimaging

## 2021-10-19 DIAGNOSIS — I6521 Occlusion and stenosis of right carotid artery: Secondary | ICD-10-CM

## 2021-10-19 NOTE — Telephone Encounter (Signed)
Patient left a voicemail on my phone stating that Dr. Anne Hahn wanted him to get a Carotid Doppler scheduled.. I don't see a order put in yet.

## 2021-10-19 NOTE — Addendum Note (Signed)
Addended by: Ann Maki on: 10/19/2021 11:29 AM   Modules accepted: Orders

## 2021-10-19 NOTE — Telephone Encounter (Signed)
Can you put a new order in for Vascular Carotid.

## 2021-10-19 NOTE — Addendum Note (Signed)
Addended by: Ann Maki on: 10/19/2021 01:21 PM   Modules accepted: Orders

## 2021-10-19 NOTE — Telephone Encounter (Signed)
Per Dr. Anne Hahn last note  The patient will have a carotid Doppler study checked in December 2022, we will check the Dopplers annually at that point.  Otherwise, the patient may follow-up here as needed.   Marlan Palau MD 08/09/2021 4:37 PM  I have placed the Carotid Doppler Study under the work in MD since Dr. Anne Hahn has retired.

## 2021-10-19 NOTE — Telephone Encounter (Signed)
Noted, medicare/aarp no auth req. Sent Lupita Leash a message she will reach out to the patient to schedule.

## 2021-10-29 ENCOUNTER — Ambulatory Visit (HOSPITAL_COMMUNITY)
Admission: RE | Admit: 2021-10-29 | Discharge: 2021-10-29 | Disposition: A | Discharge status: Home / Self Care | Payer: Medicare Other | Source: Ambulatory Visit | Attending: Diagnostic Neuroimaging | Admitting: Diagnostic Neuroimaging

## 2021-10-29 ENCOUNTER — Other Ambulatory Visit: Payer: Self-pay

## 2021-10-29 DIAGNOSIS — I6521 Occlusion and stenosis of right carotid artery: Secondary | ICD-10-CM | POA: Insufficient documentation

## 2021-10-29 NOTE — Progress Notes (Signed)
Carotid duplex has been completed.   Preliminary results in CV Proc.   Noah Pacheco 10/29/2021 2:14 PM

## 2021-12-22 ENCOUNTER — Other Ambulatory Visit (HOSPITAL_BASED_OUTPATIENT_CLINIC_OR_DEPARTMENT_OTHER): Payer: Self-pay

## 2021-12-22 DIAGNOSIS — G4700 Insomnia, unspecified: Secondary | ICD-10-CM

## 2022-01-23 ENCOUNTER — Encounter (HOSPITAL_BASED_OUTPATIENT_CLINIC_OR_DEPARTMENT_OTHER): Payer: Medicare Other | Admitting: Internal Medicine

## 2022-02-24 ENCOUNTER — Other Ambulatory Visit: Payer: Self-pay | Admitting: Orthopedic Surgery

## 2022-02-24 DIAGNOSIS — M25561 Pain in right knee: Secondary | ICD-10-CM

## 2022-02-28 ENCOUNTER — Ambulatory Visit
Admission: RE | Admit: 2022-02-28 | Discharge: 2022-02-28 | Disposition: A | Discharge status: Home / Self Care | Payer: Medicare Other | Source: Ambulatory Visit | Attending: Orthopedic Surgery | Admitting: Orthopedic Surgery

## 2022-02-28 DIAGNOSIS — M25561 Pain in right knee: Secondary | ICD-10-CM

## 2022-02-28 IMAGING — MR MR KNEE*R* W/O CM
4 of 7 series · 21 of 40 positions shown · non-contrast
Comparison: None available

CLINICAL DATA: Chronic right knee pain. Heard a pop in right knee
when standing up 1 week ago. Prior partial knee replacement in [5B].

EXAM:
MRI OF THE RIGHT KNEE WITHOUT CONTRAST
TECHNIQUE: Multiplanar, multisequence MR imaging of the knee was performed. Due
to high-grade metallic susceptibility artifact, metal reduction
protocol was performed. No intravenous contrast was administered.

[Series 3: T2 fat-sat · axial · 4.0mm · 0.29mm/px · z∈[-57,+53]mm · 5 of 31 slices shown]
[im 1/31]
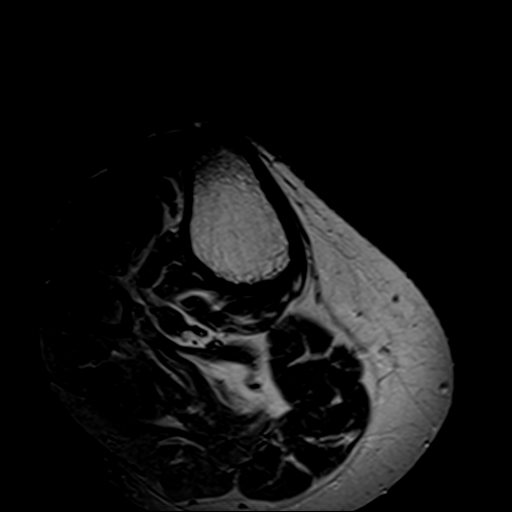
[im 6/31]
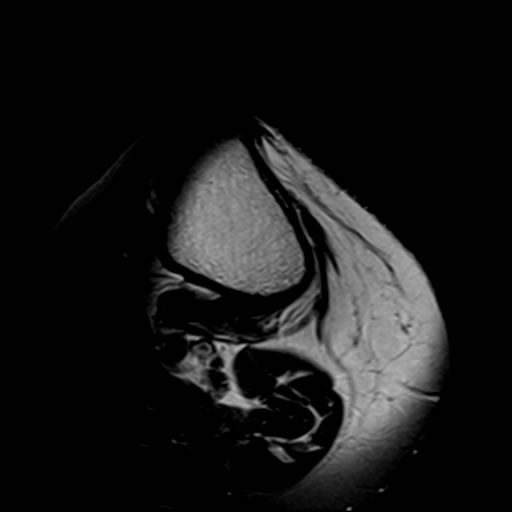
[im 11/31]
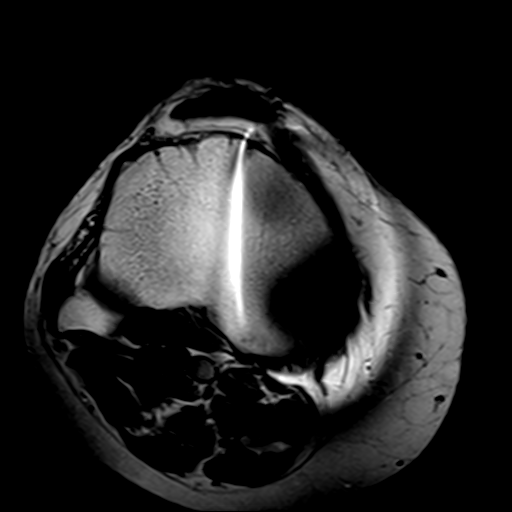
[im 16/31]
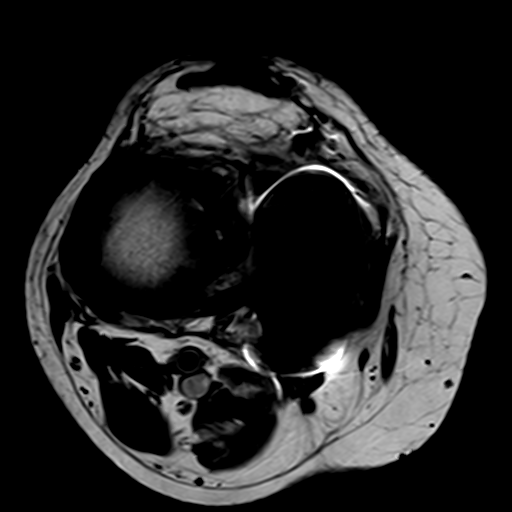
[im 26/31]
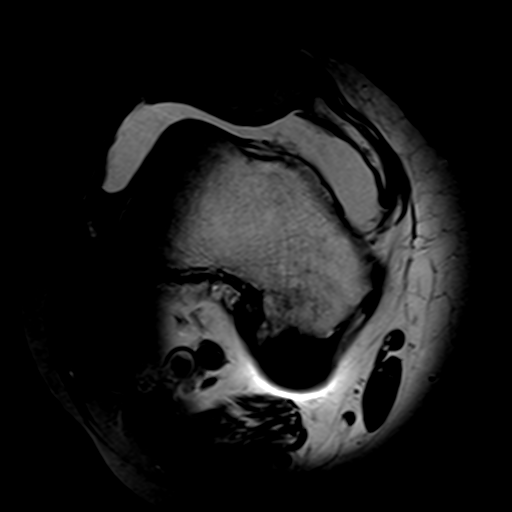

[Series 6: PD · axial · 4.0mm · 0.59mm/px · z∈[-56,+76]mm · 6 of 31 slices shown (1 of 3)]
[im 1/31]
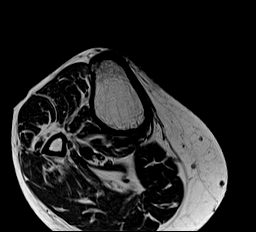
[im 7/31]
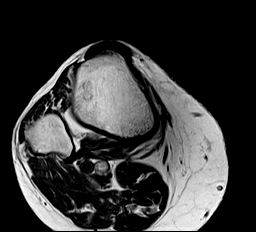
[im 13/31]
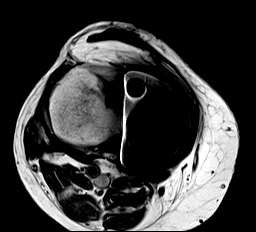
[im 19/31]
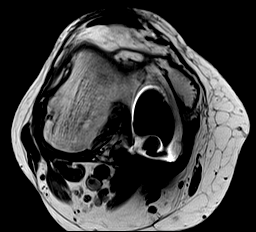
[im 25/31]
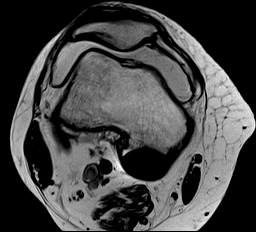
[im 31/31]
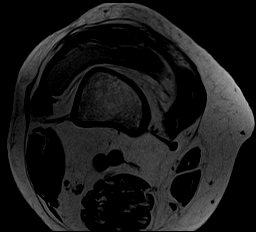

[Series 7: PD · coronal · 4.0mm · 0.29mm/px · 5 of 28 slices shown (2 of 3)]
[im 1/28]
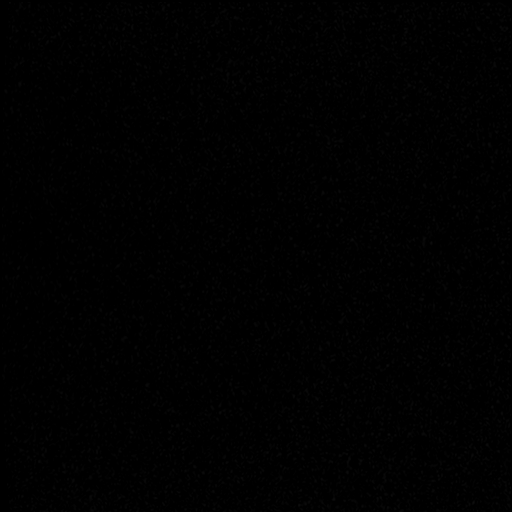
[im 7/28]
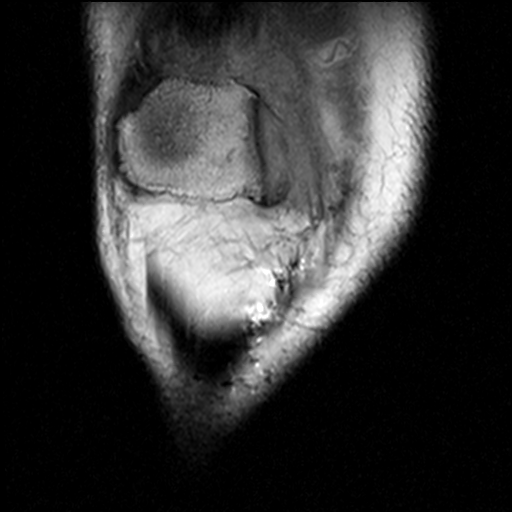
[im 14/28]
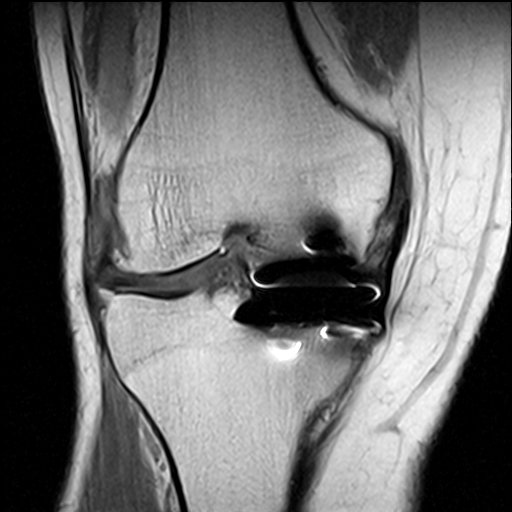
[im 21/28]
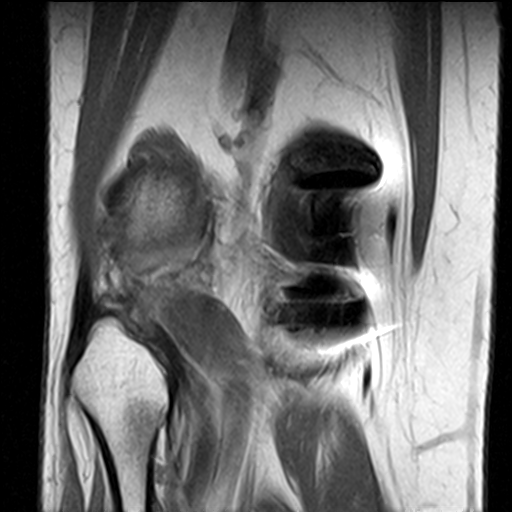
[im 28/28]
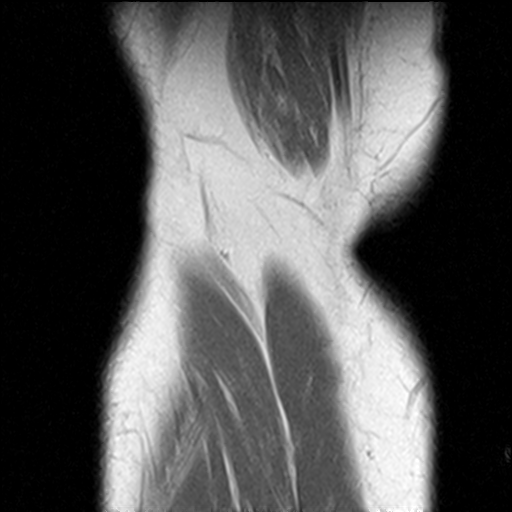

[Series 9: PD · sagittal · 4.0mm · 0.33mm/px · 5 of 28 slices shown (3 of 3)]
[im 1/28]
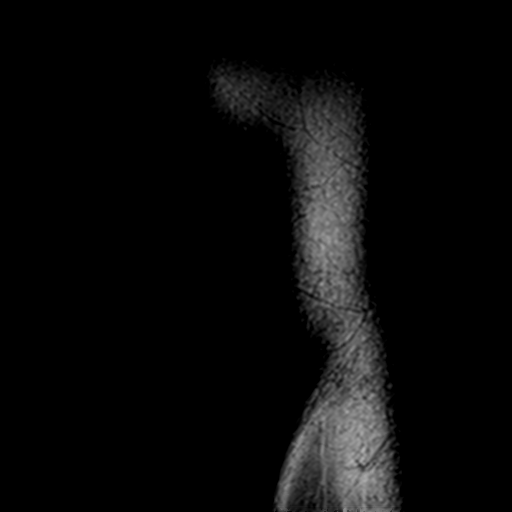
[im 7/28]
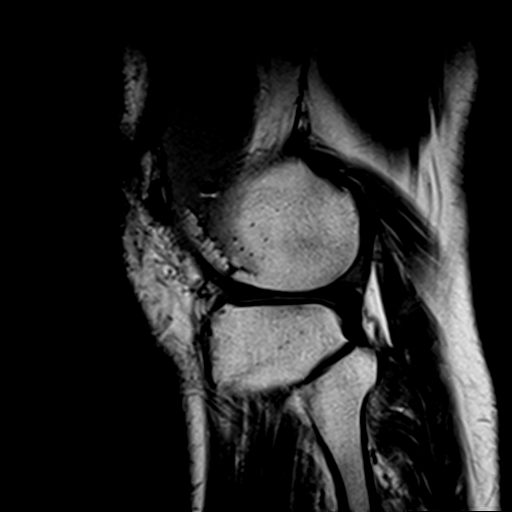
[im 14/28]
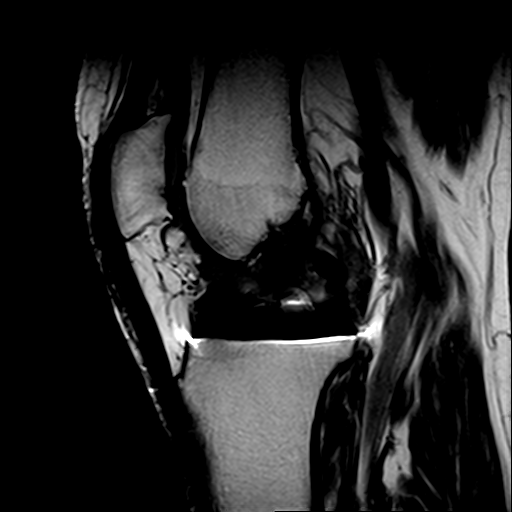
[im 21/28]
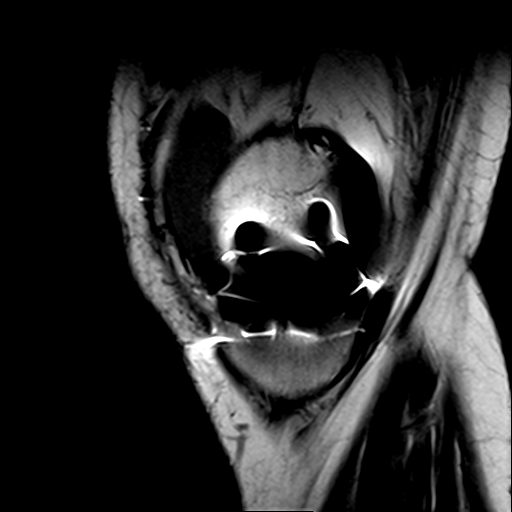
[im 28/28]
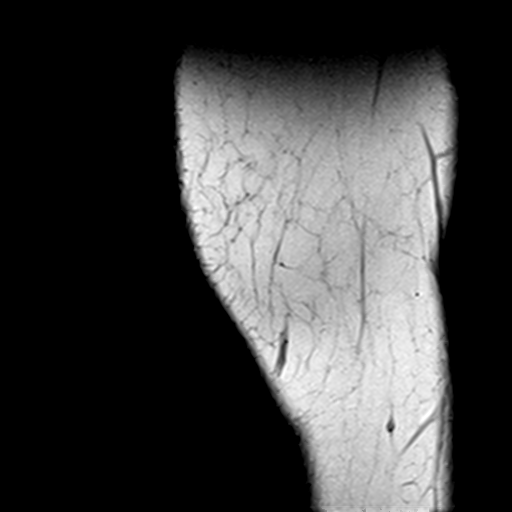

[21 of 40 positions shown; findings below may reference images not displayed]

FINDINGS: There is high-grade metal susceptibility artifact from medial
unicompartmental arthroplasty hardware that causes in homogeneous
fat saturation obscures portions of the medial greater than lateral
knee.

MENISCI

Medial meniscus:  Status post medial unicompartmental arthroplasty.

Lateral meniscus: There is diffuse abnormal increased proton density
signal throughout the 50% of the anterior horn of the lateral
meniscus closer to the root (sagittal series 9 images 8 through 12)
likely diffuse complex tearing. There also is abnormal increased
proton density signal throughout the inferior aspect of the
posterior horn of the lateral meniscus near the root (sagittal
images tendon 11), likely undersurface tears. There is horizontal
increased proton density signal throughout the anterior segment of
the body of the lateral meniscus without a definite tear extending
through an articular surface. There is mild-to-moderate extrusion of
the anterior segment of the body of the lateral meniscus.

LIGAMENTS

Cruciates: There is moderate thickening and intermediate proton
density signal throughout the ACL fibers, however at least the
majority of the fibers appear parallel and intact. There is
intermediate proton density signal that extends into the anterior
intercondylar notch, possible scarring (sagittal image 14 and axial
series 3, image 17).

Collaterals: Within the limitations of the metallic artifact, no
definite medial collateral ligament tear is seen. There may be mild
intermediate proton density signal sprain of the proximal anterior
aspect of the medial collateral ligament (axial series 6, image 11).
The fibular collateral ligament, biceps femoris tendon, iliotibial
band, and popliteus tendon are intact.

CARTILAGE

Patellofemoral: Moderate thinning of the patellar and trochlear
cartilage with full-thickness cartilage loss throughout the superior
[DATE] of the patellar apex and within multiple regions of the medial
trochlea.

Medial:  Status post unicompartmental arthroplasty.

Lateral: High-grade partial to full-thickness cartilage loss within
the far lateral aspect of the weight-bearing lateral femoral condyle
and moderate thinning of the far lateral aspect of the lateral
tibial plateau cartilage. Moderate peripheral degenerative
osteophytes.

Joint: There is a mild to moderatejoint effusion. Normal Hoffa's fat
pad. No plical thickening.

Popliteal Fossa:  No Baker's cyst.

Extensor Mechanism:  Intact quadriceps tendon and patellar tendon.

Bones:  No acute fracture or dislocation.

Other: None.
IMPRESSION: 1. Status post medial unicompartmental arthroplasty. Associated
artifact limits evaluation of the medial greater than lateral
aspects of the knee.
2. Diffuse complex tearing throughout the entire anterior horn of
the lateral meniscus. Additional tears within the inferior articular
surface of the posterior horn of the lateral meniscus near the root.
3. Possible mucoid degeneration within the ACL fibers without a
definite tear visualized. Probable scarring within the anterior
intercondylar notch.
4. Moderate to severe patellofemoral and lateral compartment
osteoarthritis.
5. Mild-to-moderate joint effusion.

## 2022-05-04 ENCOUNTER — Ambulatory Visit: Payer: Medicare Other | Admitting: Clinical

## 2022-05-25 ENCOUNTER — Ambulatory Visit: Payer: Medicare Other | Admitting: Clinical

## 2022-06-06 ENCOUNTER — Encounter: Payer: Self-pay | Admitting: *Deleted

## 2022-06-08 ENCOUNTER — Encounter: Payer: Self-pay | Admitting: Diagnostic Neuroimaging

## 2022-06-08 ENCOUNTER — Ambulatory Visit (INDEPENDENT_AMBULATORY_CARE_PROVIDER_SITE_OTHER): Payer: Medicare Other | Admitting: Diagnostic Neuroimaging

## 2022-06-08 VITALS — BP 143/88 | HR 76 | Ht 70.0 in | Wt 224.1 lb

## 2022-06-08 DIAGNOSIS — R202 Paresthesia of skin: Secondary | ICD-10-CM | POA: Diagnosis not present

## 2022-06-08 DIAGNOSIS — R200 Anesthesia of skin: Secondary | ICD-10-CM

## 2022-06-08 DIAGNOSIS — G609 Hereditary and idiopathic neuropathy, unspecified: Secondary | ICD-10-CM

## 2022-06-08 NOTE — Progress Notes (Signed)
[facility]  PATIENT: Noah Pacheco DOB: 02/24/1951  REFERRING CLINICIAN: Gwenlyn Found, MD HISTORY FROM: patient REASON FOR VISIT: new consult   HISTORICAL  CHIEF COMPLAINT:  Chief Complaint  Patient presents with   New Patient (Initial Visit)    Pt is well. He states his feet have been getting better. Room 6 alone    HISTORY OF PRESENT ILLNESS:   71 year old male here for evaluation of neuropathy.  Symptoms started in the 1990s.  In 2015 he had neurology evaluation with extensive lab testing, EMG nerve conduction study, diagnosed with idiopathic small fiber neuropathy.  He has tried gabapentin and amitriptyline the past but could not tolerate due to side effects.  No significant progression of symptoms since that time.  Currently on pregabalin 75 mg twice a day with some benefit in pain control.  Nowadays having shooting pains in the bottom of his feet.  Also has some cold feeling in his feet.   REVIEW OF SYSTEMS: Full 14 system review of systems performed and negative with exception of: asa per HPI.  ALLERGIES: Allergies  Allergen Reactions   Oxycodone     Other reaction(s): Mental Status Changes (intolerance) Causes sedation    HOME MEDICATIONS: Outpatient Encounter Medications as of 06/08/2022  Medication Sig   ALPRAZolam (XANAX) 0.5 MG tablet Take 0.5 mg by mouth at bedtime as needed for anxiety or sleep.   amLODipine (NORVASC) 5 MG tablet Take 5 mg by mouth daily.   enalapril (VASOTEC) 20 MG tablet Take 20 mg by mouth daily.   hydrochlorothiazide (HYDRODIURIL) 12.5 MG tablet Take 12.5 mg by mouth daily.   Multiple Vitamin (MULTIVITAMIN WITH MINERALS) TABS tablet Take 1 tablet by mouth daily.   pregabalin (LYRICA) 75 MG capsule Take 75 mg by mouth 2 (two) times daily.   [DISCONTINUED] PREGABALIN PO Take 75 mg by mouth in the morning and at bedtime.   allopurinol (ZYLOPRIM) 100 MG tablet Take 100 mg by mouth daily.   atorvastatin (LIPITOR) 80  MG tablet Take 80 mg by mouth daily.   mirtazapine (REMERON) 15 MG tablet Take by mouth.   [DISCONTINUED] ampicillin (PRINCIPEN) 500 MG capsule Take 500 mg by mouth in the morning and at bedtime. (Patient not taking: Reported on 06/08/2022)   [DISCONTINUED] Buprenorphine HCl-Naloxone HCl 4-1 MG FILM Place 0.5 strips under the tongue 2 (two) times daily. (Patient not taking: Reported on 06/08/2022)   [DISCONTINUED] cyclobenzaprine (FLEXERIL) 5 MG tablet Take 5 mg by mouth 3 (three) times daily as needed. (Patient not taking: Reported on 06/08/2022)   No facility-administered encounter medications on file as of 06/08/2022.      PAST MEDICAL HISTORY: Past Medical History:  Diagnosis Date   Anemia    Anxiety    Carotid stenosis, right 08/09/2021   CKD (chronic kidney disease)    DDD (degenerative disc disease), cervical    Gout    Hepatic steatosis    Hyperlipidemia    Hypertension    Insomnia    Peripheral neuropathy    Prediabetes     PAST SURGICAL HISTORY: Past Surgical History:  Procedure Laterality Date   KNEE SURGERY Right 2015    FAMILY HISTORY: No family history on file.  SOCIAL HISTORY: Social History   Socioeconomic History   Marital status: Married    Spouse name: Clydie Braun   Number of children: Not on file   Years of education: Not on file   Highest education level: Not on file  Occupational History  Not on file  Tobacco Use   Smoking status: Never   Smokeless tobacco: Never  Substance and Sexual Activity   Alcohol use: Not Currently   Drug use: Not Currently   Sexual activity: Not on file  Other Topics Concern   Not on file  Social History Narrative   Lives with wife   Right Handed   Drinks 1-2 cups caffeine daily   Social Determinants of Health   Financial Resource Strain: Not on file  Food Insecurity: Not on file  Transportation Needs: Not on file  Physical Activity: Not on file  Stress: Not on file  Social Connections: Not on file  Intimate  Partner Violence: Not on file     PHYSICAL EXAM  GENERAL EXAM/CONSTITUTIONAL: Vitals:  Vitals:   06/08/22 0913  BP: (!) 143/88  Pulse: 76  Weight: 224 lb 2 oz (101.7 kg)  Height: 5\' 10"  (1.778 m)   Body mass index is 32.16 kg/m. Wt Readings from Last 3 Encounters:  06/08/22 224 lb 2 oz (101.7 kg)  08/09/21 250 lb 9.6 oz (113.7 kg)  04/22/21 229 lb (103.9 kg)   Patient is in no distress; well developed, nourished and groomed; neck is supple  CARDIOVASCULAR: Examination of carotid arteries is normal; no carotid bruits Regular rate and rhythm, no murmurs Examination of peripheral vascular system by observation and palpation is normal  EYES: Ophthalmoscopic exam of optic discs and posterior segments is normal; no papilledema or hemorrhages No results found.  MUSCULOSKELETAL: Gait, strength, tone, movements noted in Neurologic exam below  NEUROLOGIC: MENTAL STATUS:      No data to display         awake, alert, oriented to person, place and time recent and remote memory intact normal attention and concentration language fluent, comprehension intact, naming intact fund of knowledge appropriate  CRANIAL NERVE:  2nd - no papilledema on fundoscopic exam 2nd, 3rd, 4th, 6th - pupils equal and reactive to light, visual fields full to confrontation, extraocular muscles intact, no nystagmus 5th - facial sensation symmetric 7th - facial strength symmetric 8th - hearing intact 9th - palate elevates symmetrically, uvula midline 11th - shoulder shrug symmetric 12th - tongue protrusion midline  MOTOR:  normal bulk and tone, full strength in the BUE, BLE  SENSORY:  normal and symmetric to light touch, temperature, vibration  COORDINATION:  finger-nose-finger, fine finger movements normal  REFLEXES:  deep tendon reflexes TRACE and symmetric; ABSENT IN BLE  GAIT/STATION:  narrow based gait     DIAGNOSTIC DATA (LABS, IMAGING, TESTING) - I reviewed patient  records, labs, notes, testing and imaging myself where available.  Lab Results  Component Value Date   WBC 12.4 (H) 04/20/2021   HGB 13.1 04/20/2021   HCT 39.2 04/20/2021   MCV 99.0 04/20/2021   PLT 191 04/20/2021      Component Value Date/Time   NA 137 04/20/2021 0912   K 3.2 (L) 04/20/2021 0912   CL 98 04/20/2021 0912   CO2 31 04/20/2021 0912   GLUCOSE 122 (H) 04/20/2021 0912   BUN 25 (H) 04/20/2021 0912   CREATININE 1.37 (H) 04/20/2021 0912   CALCIUM 9.3 04/20/2021 0912   PROT 6.4 (L) 04/20/2021 0912   ALBUMIN 3.9 04/20/2021 0912   AST 31 04/20/2021 0912   ALT 36 04/20/2021 0912   ALKPHOS 82 04/20/2021 0912   BILITOT 0.7 04/20/2021 0912   GFRNONAA 55 (L) 04/20/2021 0912   Lab Results  Component Value Date   CHOL 135 03/18/2021  HDL 39 (L) 03/18/2021   LDLCALC 58 03/18/2021   TRIG 188 (H) 03/18/2021   CHOLHDL 3.5 03/18/2021   Lab Results  Component Value Date   HGBA1C 5.8 (H) 03/18/2021   Lab Results  Component Value Date   VITAMINB12 719 03/18/2021   Lab Results  Component Value Date   TSH 1.284 03/18/2021    04/20/21 CTA neck: 1. 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 2. Bilateral carotid bifurcation atherosclerosis with approximately 70% stenosis on the right and 20% stenosis on the left. 3. Approximately 40% left vertebral artery origin stenosis.  10/29/21 carotid u/s Right Carotid: Velocities in the right ICA are consistent with a 40-59% stenosis.   Left Carotid: Velocities in the left ICA are consistent with a 1-39%  stenosis.   Vertebrals: Left vertebral artery demonstrates antegrade flow. Right  vertebral artery was not visualized.   ASSESSMENT AND PLAN  71 y.o. year old male here with:   Dx:  1. Numbness and tingling of both feet   2. Idiopathic small fiber peripheral neuropathy       PLAN:  Idiopathic / hereditary  small fiber neuropathy (since ~ 1990's; extensive workup in 2015 negative) - continue pregabalin 75mg  twice a day  - consider capsaicin cream, lidocaine patch / cream, alpha-lipoic acid 600mg  daily - check ABI to rule out peripheral vascular disease  ASYMPTOMATIC RIGHT INTERNAL CAROTID STENOSIS  - medical mgmt; follow up with PCP or vascular surgery for monitoring  Orders Placed This Encounter  Procedures   VAS ABI WITH/WO TBI   Return for return to PCP.    , MD 06/08/2022, 10:05 AM Certified in Neurology, Neurophysiology and Neuroimaging  [facility] [address], [address] [place], [address] [place] [phone]

## 2022-06-13 ENCOUNTER — Ambulatory Visit (HOSPITAL_COMMUNITY)
Admission: RE | Admit: 2022-06-13 | Discharge: 2022-06-13 | Disposition: A | Discharge status: Home / Self Care | Payer: Medicare Other | Source: Ambulatory Visit | Attending: Diagnostic Neuroimaging | Admitting: Diagnostic Neuroimaging

## 2022-06-13 DIAGNOSIS — R202 Paresthesia of skin: Secondary | ICD-10-CM | POA: Diagnosis present

## 2022-06-13 DIAGNOSIS — R200 Anesthesia of skin: Secondary | ICD-10-CM

## 2022-06-22 ENCOUNTER — Encounter: Payer: Self-pay | Admitting: *Deleted

## 2022-10-01 ENCOUNTER — Encounter (HOSPITAL_BASED_OUTPATIENT_CLINIC_OR_DEPARTMENT_OTHER): Payer: Self-pay | Admitting: Emergency Medicine

## 2022-10-01 ENCOUNTER — Other Ambulatory Visit: Payer: Self-pay

## 2022-10-01 ENCOUNTER — Emergency Department (HOSPITAL_BASED_OUTPATIENT_CLINIC_OR_DEPARTMENT_OTHER)
Admission: EM | Admit: 2022-10-01 | Discharge: 2022-10-01 | Disposition: A | Discharge status: Home / Self Care | Payer: Medicare Other | Attending: Emergency Medicine | Admitting: Emergency Medicine

## 2022-10-01 ENCOUNTER — Emergency Department (HOSPITAL_BASED_OUTPATIENT_CLINIC_OR_DEPARTMENT_OTHER): Payer: Medicare Other | Admitting: Radiology

## 2022-10-01 DIAGNOSIS — G8929 Other chronic pain: Secondary | ICD-10-CM

## 2022-10-01 DIAGNOSIS — Z79899 Other long term (current) drug therapy: Secondary | ICD-10-CM | POA: Diagnosis not present

## 2022-10-01 DIAGNOSIS — M1711 Unilateral primary osteoarthritis, right knee: Secondary | ICD-10-CM | POA: Diagnosis not present

## 2022-10-01 DIAGNOSIS — I10 Essential (primary) hypertension: Secondary | ICD-10-CM | POA: Diagnosis not present

## 2022-10-01 DIAGNOSIS — M25561 Pain in right knee: Secondary | ICD-10-CM | POA: Diagnosis present

## 2022-10-01 DIAGNOSIS — Z96653 Presence of artificial knee joint, bilateral: Secondary | ICD-10-CM | POA: Diagnosis not present

## 2022-10-01 MED ORDER — ACETAMINOPHEN 500 MG PO TABS
1000.0000 mg | ORAL_TABLET | Freq: Once | ORAL | Status: AC
  Administered 2022-10-01: 1000 mg via ORAL
  Filled 2022-10-01: qty 2

## 2022-10-01 MED ORDER — IBUPROFEN 400 MG PO TABS
400.0000 mg | ORAL_TABLET | Freq: Once | ORAL | Status: AC
  Administered 2022-10-01: 400 mg via ORAL
  Filled 2022-10-01: qty 1

## 2022-10-01 MED ORDER — TRAMADOL HCL 50 MG PO TABS: 50.0000 mg | ORAL_TABLET | Freq: Four times a day (QID) | ORAL | 0 refills | Status: AC | PRN

## 2022-10-01 NOTE — ED Triage Notes (Signed)
Right partial knee replacement 3 months ago, pain just isnt going away, has been to therapy, cortisone shot did not help.

## 2022-10-01 NOTE — ED Notes (Signed)
Wife of patient called and was wanting updates. I informed her that once the Doctor sees him and assess him, please let the Nurse contact Wife with any updates.

## 2022-10-01 NOTE — ED Provider Notes (Signed)
[facility] EMERGENCY DEPT Provider Note   CSN: [id number] Arrival date & time: 10/01/22  1101     History  Chief Complaint  Patient presents with   Knee Pain    Pancho Rushing is a 71 y.o. male.  Pt c/o right knee pain - notes hx chronic bilateral knee pain - remote hx left tka, and states only minimal discomfort there. States had 'partial knee replacement' on right ad few months ago and c/o persistent pain especially to lateral aspect right knee. Denies recent injury. No acute worsening swelling or redness to area. No fever or chills.  No new RLE edema.   The history is provided by the patient and medical records.  Knee Pain Associated symptoms: no fever        Home Medications Prior to Admission medications   Medication Sig Start Date End Date Taking? Authorizing Provider  allopurinol (ZYLOPRIM) 100 MG tablet Take 100 mg by mouth daily. 04/09/21   [provider]  ALPRAZolam Prudy Feeler) 0.5 MG tablet Take 0.5 mg by mouth at bedtime as needed for anxiety or sleep.    [provider]  amLODipine (NORVASC) 5 MG tablet Take 5 mg by mouth daily. 04/19/21   [provider]  atorvastatin (LIPITOR) 80 MG tablet Take 80 mg by mouth daily.    [provider]  enalapril (VASOTEC) 20 MG tablet Take 20 mg by mouth daily.    [provider]  hydrochlorothiazide (HYDRODIURIL) 12.5 MG tablet Take 12.5 mg by mouth daily.    [provider]  mirtazapine (REMERON) 15 MG tablet Take by mouth. 07/21/21 09/01/21  [provider]  Multiple Vitamin (MULTIVITAMIN WITH MINERALS) TABS tablet Take 1 tablet by mouth daily.    [provider]  pregabalin (LYRICA) 75 MG capsule Take 75 mg by mouth 2 (two) times daily.    [provider]      Allergies    Oxycodone    Review of Systems   Review of Systems  Constitutional:  Negative for chills and fever.  Respiratory:  Negative for shortness of breath.    Cardiovascular:  Negative for chest pain.    Physical Exam Updated Vital Signs BP (!) 126/106 (BP Location: Right Arm)   Pulse 81   Temp 98 F (36.7 C) (Oral)   Resp 16   SpO2 93%  Physical Exam Vitals and nursing note reviewed.  Constitutional:      Appearance: Normal appearance. He is well-developed.  Eyes:     General: No scleral icterus.    Conjunctiva/sclera: Conjunctivae normal.  Neck:     Trachea: No tracheal deviation.  Cardiovascular:     Rate and Rhythm: Normal rate.     Pulses: Normal pulses.  Pulmonary:     Effort: Pulmonary effort is normal. No accessory muscle usage or respiratory distress.  Abdominal:     General: Bowel sounds are normal.  Musculoskeletal:     Comments: Right knee w mild swelling. Tenderness anterior laterally. Prior surgical incision is healed without sign of infection. No erythema or increased warmth to knee. No pain w passive rom. No RLE edema or calf pain/tenderness. Distal pulses palp.  Knee knee stable, non tender, no pain w rom.   Skin:    General: Skin is warm and dry.     Findings: No rash.  Neurological:     Mental Status: He is alert.     Comments: Alert, speech clear.   Psychiatric:  Mood and Affect: Mood normal.     ED Results / Procedures / Treatments   Labs (all labs ordered are listed, but only abnormal results are displayed) Labs Reviewed - No data to display  EKG None  Radiology DG Knee Complete 4 Views Right  Result Date: 10/01/2022 CLINICAL DATA:  Pain, previous partial arthroplasty EXAM: RIGHT KNEE - COMPLETE 4+ VIEW COMPARISON:  MR knee done on 02/28/2022 FINDINGS: There is partial arthroplasty in the medial compartment. No recent fracture or dislocation is seen. Degenerative changes are noted with bony spurs in lateral and patellofemoral compartments. There is joint space narrowing in the lateral compartment. There is linear smooth marginated calcification adjacent to the upper margin of patella,  possibly residual from previous injury. Scattered vascular calcifications are seen. IMPRESSION: No recent fracture or dislocation is seen. There is previous arthroplasty in the medial compartment. Degenerative changes are noted in lateral and patellofemoral compartments. Electronically Signed   By: Ernie Avena M.D.   On: 10/01/2022 12:43    Procedures Procedures    Medications Ordered in ED Medications - No data to display  ED Course/ Medical Decision Making/ A&P                           Medical Decision Making Problems Addressed: Acute pain of right knee: acute illness or injury Chronic pain of right knee: chronic illness or injury with exacerbation, progression, or side effects of treatment that poses a threat to life or bodily functions Essential hypertension: chronic illness or injury with exacerbation, progression, or side effects of treatment that poses a threat to life or bodily functions Primary osteoarthritis of right knee: chronic illness or injury with exacerbation, progression, or side effects of treatment  Amount and/or Complexity of Data Reviewed External Data Reviewed: radiology and notes. Radiology: ordered and independent interpretation performed. Decision-making details documented in ED Course.  Risk OTC drugs. Prescription drug management.   Xrays.   Xrays reviewed/interpreted by me - degenerative changes noted, esp laterally.   Reviewed nursing notes and prior charts for additional history.  Prior notes reviewed. It appears likely that pt may need tka in future.   Acetaminophen po, ibuprofen po. Rx for home.   Rec ortho f/u.  Return precautions provided.         Final Clinical Impression(s) / ED Diagnoses Final diagnoses:  None    Rx / DC Orders ED Discharge Orders     None         Cathren Laine, MD 10/01/22 1446

## 2022-10-01 NOTE — Discharge Instructions (Addendum)
It was our pleasure to provide your ER care today - we hope that you feel better.  Take acetaminophen and/or ibuprofen/aleve as need for pain.  You may also take ultram as need for pain - no driving when taking.  Follow up with your orthopedist in the next couple weeks - discuss possible total knee replacement as potential option.  Return to ER if worse, new symptoms, fevers, increased swelling/redness to knee, or other concern.

## 2023-07-25 ENCOUNTER — Emergency Department (HOSPITAL_BASED_OUTPATIENT_CLINIC_OR_DEPARTMENT_OTHER): Payer: Medicare Other | Admitting: Radiology

## 2023-07-25 ENCOUNTER — Other Ambulatory Visit: Payer: Self-pay

## 2023-07-25 ENCOUNTER — Observation Stay (HOSPITAL_COMMUNITY): Payer: Medicare Other

## 2023-07-25 ENCOUNTER — Observation Stay (HOSPITAL_BASED_OUTPATIENT_CLINIC_OR_DEPARTMENT_OTHER)
Admission: EM | Admit: 2023-07-25 | Discharge: 2023-07-26 | Disposition: A | Discharge status: Home / Self Care | Payer: Medicare Other | Attending: Family Medicine | Admitting: Family Medicine

## 2023-07-25 ENCOUNTER — Encounter (HOSPITAL_BASED_OUTPATIENT_CLINIC_OR_DEPARTMENT_OTHER): Payer: Self-pay | Admitting: Emergency Medicine

## 2023-07-25 ENCOUNTER — Emergency Department (HOSPITAL_BASED_OUTPATIENT_CLINIC_OR_DEPARTMENT_OTHER): Payer: Medicare Other

## 2023-07-25 DIAGNOSIS — Z96651 Presence of right artificial knee joint: Secondary | ICD-10-CM | POA: Insufficient documentation

## 2023-07-25 DIAGNOSIS — G9341 Metabolic encephalopathy: Secondary | ICD-10-CM | POA: Insufficient documentation

## 2023-07-25 DIAGNOSIS — G8929 Other chronic pain: Secondary | ICD-10-CM | POA: Insufficient documentation

## 2023-07-25 DIAGNOSIS — Z79899 Other long term (current) drug therapy: Secondary | ICD-10-CM | POA: Diagnosis not present

## 2023-07-25 DIAGNOSIS — R4182 Altered mental status, unspecified: Principal | ICD-10-CM | POA: Diagnosis present

## 2023-07-25 DIAGNOSIS — Z1152 Encounter for screening for COVID-19: Secondary | ICD-10-CM | POA: Insufficient documentation

## 2023-07-25 DIAGNOSIS — R410 Disorientation, unspecified: Secondary | ICD-10-CM | POA: Diagnosis not present

## 2023-07-25 DIAGNOSIS — Z7982 Long term (current) use of aspirin: Secondary | ICD-10-CM | POA: Insufficient documentation

## 2023-07-25 DIAGNOSIS — N179 Acute kidney failure, unspecified: Secondary | ICD-10-CM | POA: Diagnosis not present

## 2023-07-25 LAB — COMPREHENSIVE METABOLIC PANEL
ALT: 18 U/L (ref 0–44)
AST: 18 U/L (ref 15–41)
Albumin: 4.3 g/dL (ref 3.5–5.0)
Alkaline Phosphatase: 159 U/L — ABNORMAL HIGH (ref 38–126)
Anion gap: 9 (ref 5–15)
BUN: 51 mg/dL — ABNORMAL HIGH (ref 8–23)
CO2: 27 mmol/L (ref 22–32)
Calcium: 9.3 mg/dL (ref 8.9–10.3)
Chloride: 102 mmol/L (ref 98–111)
Creatinine, Ser: 2.7 mg/dL — ABNORMAL HIGH (ref 0.61–1.24)
GFR, Estimated: 24 mL/min — ABNORMAL LOW (ref >60)
Glucose, Bld: 90 mg/dL (ref 70–99)
Potassium: 4.7 mmol/L (ref 3.5–5.1)
Sodium: 138 mmol/L (ref 135–145)
Total Bilirubin: 1 mg/dL (ref 0.3–1.2)
Total Protein: 7 g/dL (ref 6.5–8.1)

## 2023-07-25 LAB — URINALYSIS, ROUTINE W REFLEX MICROSCOPIC
Bilirubin Urine: NEGATIVE
Glucose, UA: NEGATIVE mg/dL
Hgb urine dipstick: NEGATIVE
Ketones, ur: NEGATIVE mg/dL
Leukocytes,Ua: NEGATIVE
Nitrite: NEGATIVE
Specific Gravity, Urine: 1.017 (ref 1.005–1.030)
pH: 5 (ref 5.0–8.0)

## 2023-07-25 LAB — CBC WITH DIFFERENTIAL/PLATELET
Abs Immature Granulocytes: 0.02 10*3/uL (ref 0.00–0.07)
Basophils Absolute: 0 10*3/uL (ref 0.0–0.1)
Basophils Relative: 0 %
Eosinophils Absolute: 0.3 10*3/uL (ref 0.0–0.5)
Eosinophils Relative: 6 %
HCT: 34.5 % — ABNORMAL LOW (ref 39.0–52.0)
Hemoglobin: 11 g/dL — ABNORMAL LOW (ref 13.0–17.0)
Immature Granulocytes: 0 %
Lymphocytes Relative: 19 %
Lymphs Abs: 1.1 10*3/uL (ref 0.7–4.0)
MCH: 28.4 pg (ref 26.0–34.0)
MCHC: 31.9 g/dL (ref 30.0–36.0)
MCV: 89.1 fL (ref 80.0–100.0)
Monocytes Absolute: 0.6 10*3/uL (ref 0.1–1.0)
Monocytes Relative: 10 %
Neutro Abs: 3.9 10*3/uL (ref 1.7–7.7)
Neutrophils Relative %: 65 %
Platelets: 144 10*3/uL — ABNORMAL LOW (ref 150–400)
RBC: 3.87 MIL/uL — ABNORMAL LOW (ref 4.22–5.81)
RDW: 15.7 % — ABNORMAL HIGH (ref 11.5–15.5)
WBC: 6 10*3/uL (ref 4.0–10.5)
nRBC: 0 % (ref 0.0–0.2)

## 2023-07-25 LAB — ETHANOL: Alcohol, Ethyl (B): <10 mg/dL (ref <10)

## 2023-07-25 LAB — RAPID URINE DRUG SCREEN, HOSP PERFORMED
Amphetamines: NOT DETECTED
Barbiturates: NOT DETECTED
Benzodiazepines: POSITIVE — AB
Cocaine: NOT DETECTED
Opiates: POSITIVE — AB
Tetrahydrocannabinol: NOT DETECTED

## 2023-07-25 LAB — CBG MONITORING, ED: Glucose-Capillary: 71 mg/dL (ref 70–99)

## 2023-07-25 LAB — SARS CORONAVIRUS 2 BY RT PCR: SARS Coronavirus 2 by RT PCR: NEGATIVE

## 2023-07-25 LAB — LACTIC ACID, PLASMA: Lactic Acid, Venous: 1.1 mmol/L (ref 0.5–1.9)

## 2023-07-25 LAB — AMMONIA: Ammonia: 19 umol/L (ref 9–35)

## 2023-07-25 MED ORDER — ACETAMINOPHEN 325 MG PO TABS: 650.0000 mg | ORAL_TABLET | Freq: Four times a day (QID) | ORAL | Status: DC | PRN

## 2023-07-25 MED ORDER — ENOXAPARIN SODIUM 40 MG/0.4ML IJ SOSY
40.0000 mg | PREFILLED_SYRINGE | INTRAMUSCULAR | Status: DC
  Administered 2023-07-25: 40 mg via SUBCUTANEOUS
  Filled 2023-07-25: qty 0.4

## 2023-07-25 MED ORDER — AMLODIPINE BESYLATE 5 MG PO TABS
5.0000 mg | ORAL_TABLET | Freq: Every day | ORAL | Status: DC
  Administered 2023-07-26: 5 mg via ORAL
  Filled 2023-07-25: qty 1

## 2023-07-25 MED ORDER — PREGABALIN 75 MG PO CAPS
75.0000 mg | ORAL_CAPSULE | Freq: Two times a day (BID) | ORAL | Status: DC
  Administered 2023-07-25 – 2023-07-26 (×2): 75 mg via ORAL
  Filled 2023-07-25 (×2): qty 1

## 2023-07-25 MED ORDER — SODIUM CHLORIDE 0.9 % IV BOLUS
1000.0000 mL | Freq: Once | INTRAVENOUS | Status: AC
  Administered 2023-07-25: 1000 mL via INTRAVENOUS

## 2023-07-25 MED ORDER — ACETAMINOPHEN 650 MG RE SUPP: 650.0000 mg | Freq: Four times a day (QID) | RECTAL | Status: DC | PRN

## 2023-07-25 MED ORDER — SODIUM CHLORIDE 0.9 % IV SOLN: INTRAVENOUS | Status: DC

## 2023-07-25 MED ORDER — ONDANSETRON HCL 4 MG/2ML IJ SOLN: 4.0000 mg | Freq: Four times a day (QID) | INTRAMUSCULAR | Status: DC | PRN

## 2023-07-25 MED ORDER — ASPIRIN 81 MG PO TBEC
81.0000 mg | DELAYED_RELEASE_TABLET | Freq: Every day | ORAL | Status: DC
  Administered 2023-07-26: 81 mg via ORAL
  Filled 2023-07-25: qty 1

## 2023-07-25 MED ORDER — HYDROCODONE-ACETAMINOPHEN 5-325 MG PO TABS
1.0000 | ORAL_TABLET | ORAL | Status: DC | PRN
  Administered 2023-07-25: 2 via ORAL
  Filled 2023-07-25: qty 2

## 2023-07-25 MED ORDER — ONDANSETRON HCL 4 MG PO TABS: 4.0000 mg | ORAL_TABLET | Freq: Four times a day (QID) | ORAL | Status: DC | PRN

## 2023-07-25 MED ORDER — ALLOPURINOL 100 MG PO TABS
100.0000 mg | ORAL_TABLET | Freq: Every day | ORAL | Status: DC
  Administered 2023-07-26: 100 mg via ORAL
  Filled 2023-07-25: qty 1

## 2023-07-25 MED ORDER — ATORVASTATIN CALCIUM 80 MG PO TABS
80.0000 mg | ORAL_TABLET | Freq: Every day | ORAL | Status: DC
  Administered 2023-07-25 – 2023-07-26 (×2): 80 mg via ORAL
  Filled 2023-07-25 (×2): qty 1

## 2023-07-25 NOTE — ED Provider Notes (Signed)
[place] EMERGENCY DEPARTMENT AT [facility] Provider Note   CSN: [id number] Arrival date & time: 07/25/23  1332     History {Add pertinent medical, surgical, social history, OB history to HPI:1} Chief Complaint  Patient presents with   Altered Mental Status    Noah Pacheco is a 72 y.o. male with a past medical history of anemia, CKD, gout, hyperlipidemia, hypertension, prediabetes presents today for evaluation of altered mental status.  Per wife at bedside patient woke up this morning not remembering how to turn off the alarm clock.  He also was told to feed the dog but he is having a long time taking in the food and not knowing how to feed the dog.  Patient wife said that his mental status was at baseline yesterday, no abnormality that she noticed yesterday.  Patient was reported to have a low-grade fever of 99.9.  He complains of headache and neck pain since yesterday.  He denies any dizziness, vision changes, nausea, vomiting, chest pain, shortness of breath, bowel change, urinary symptoms.  Patient's wife reports that the patient has his blood drawn last month which showed abnormal liver function test.  Patient has been taking narcotics for his knee pain but reports no increase in doses recently.   Altered Mental Status   Past Medical History:  Diagnosis Date   Anemia    Anxiety    Carotid stenosis, right 08/09/2021   CKD (chronic kidney disease)    DDD (degenerative disc disease), cervical    Gout    Hepatic steatosis    Hyperlipidemia    Hypertension    Insomnia    Peripheral neuropathy    Prediabetes    Past Surgical History:  Procedure Laterality Date   KNEE SURGERY Right 2015     Home Medications Prior to Admission medications   Medication Sig Start Date End Date Taking? Authorizing Provider  celecoxib (CELEBREX) 200 MG capsule Take 200 mg by mouth 2 (two) times daily. 10/11/22  Yes [provider]  naloxone (NARCAN) nasal spray 4 mg/0.1 mL  Place 1 spray into the nose once. 07/21/23  Yes [provider]  sertraline (ZOLOFT) 100 MG tablet Take 1 tablet by mouth daily. 11/28/21  Yes [provider]  allopurinol (ZYLOPRIM) 100 MG tablet Take 100 mg by mouth daily. 04/09/21   [provider]  ALPRAZolam Prudy Feeler) 0.5 MG tablet Take 0.5 mg by mouth at bedtime as needed for anxiety or sleep.    [provider]  amLODipine (NORVASC) 5 MG tablet Take 5 mg by mouth daily. 04/19/21   [provider]  atorvastatin (LIPITOR) 80 MG tablet Take 80 mg by mouth daily.    [provider]  enalapril (VASOTEC) 20 MG tablet Take 20 mg by mouth daily.    [provider]  hydrochlorothiazide (HYDRODIURIL) 12.5 MG tablet Take 12.5 mg by mouth daily.    [provider]  mirtazapine (REMERON) 15 MG tablet Take by mouth. 07/21/21 09/01/21  [provider]  Multiple Vitamin (MULTIVITAMIN WITH MINERALS) TABS tablet Take 1 tablet by mouth daily.    [provider]  Oxycodone HCl 10 MG TABS Take 10 mg by mouth every 4 (four) hours as needed. (Every 4-6 hours)    [provider]  pregabalin (LYRICA) 75 MG capsule Take 75 mg by mouth 2 (two) times daily.    [provider]  traMADol (ULTRAM) 50 MG tablet Take 1 tablet (50 mg total) by mouth every 6 (six) hours  as needed. 10/01/22   Cathren Laine, MD      Allergies    Oxycodone    Review of Systems   Review of Systems Negative except as per HPI.  Physical Exam Updated Vital Signs BP 118/61 (BP Location: Left Arm)   Pulse 85   Temp 99.6 F (37.6 C) (Oral)   Resp (!) 22   SpO2 93%  Physical Exam Vitals and nursing note reviewed.  Constitutional:      Appearance: Normal appearance.  HENT:     Head: Normocephalic and atraumatic.     Mouth/Throat:     Mouth: Mucous membranes are moist.  Eyes:     General: No scleral icterus. Cardiovascular:     Rate and Rhythm: Normal rate and regular rhythm.      Pulses: Normal pulses.     Heart sounds: Normal heart sounds.  Pulmonary:     Effort: Pulmonary effort is normal.     Breath sounds: Normal breath sounds.  Abdominal:     General: Abdomen is flat.     Palpations: Abdomen is soft.     Tenderness: There is no abdominal tenderness.  Musculoskeletal:        General: No deformity.  Skin:    General: Skin is warm.     Findings: No rash.  Neurological:     General: No focal deficit present.     Mental Status: He is alert.     Comments: Patient is A&O x 3. Cranial nerves II through XII intact. Intact sensation to light touch in all 4 extremities. 5/5 strength in all 4 extremities. Intact finger-to-nose and heel-to-shin of all 4 extremities. No visual field cuts. No neglect noted. No aphasia noted.   Psychiatric:        Mood and Affect: Mood normal.     ED Results / Procedures / Treatments   Labs (all labs ordered are listed, but only abnormal results are displayed) Labs Reviewed  COMPREHENSIVE METABOLIC PANEL - Abnormal; Notable for the following components:      Result Value   BUN 51 (*)    Creatinine, Ser 2.70 (*)    Alkaline Phosphatase 159 (*)    GFR, Estimated 24 (*)    All other components within normal limits  CBC WITH DIFFERENTIAL/PLATELET - Abnormal; Notable for the following components:   RBC 3.87 (*)    Hemoglobin 11.0 (*)    HCT 34.5 (*)    RDW 15.7 (*)    Platelets 144 (*)    All other components within normal limits  SARS CORONAVIRUS 2 BY RT PCR  LACTIC ACID, PLASMA  LACTIC ACID, PLASMA  ETHANOL  RAPID URINE DRUG SCREEN, HOSP PERFORMED  AMMONIA  URINALYSIS, ROUTINE W REFLEX MICROSCOPIC  CBG MONITORING, ED    EKG EKG Interpretation Date/Time:  Tuesday July 25 2023 13:46:28 EDT Ventricular Rate:  85 PR Interval:  180 QRS Duration:  113 QT Interval:  346 QTC Calculation: 412 R Axis:   9  Text Interpretation: Sinus rhythm Borderline intraventricular conduction delay No significant change since last  tracing Confirmed by Gwyneth Sprout ([id number]) on 07/25/2023 1:56:07 PM  Radiology No results found.  Procedures Procedures  {Document cardiac monitor, telemetry assessment procedure when appropriate:1}  Medications Ordered in ED Medications  sodium chloride 0.9 % bolus 1,000 mL (has no administration in time range)    ED Course/ Medical Decision Making/ A&P   {   Click here for ABCD2, HEART and other calculatorsREFRESH Note before signing :1}  Medical Decision Making Amount and/or Complexity of Data Reviewed Labs: ordered. Radiology: ordered.  Risk Decision regarding hospitalization.   This patient presents to the ED for ***, this involves an extensive number of treatment options, and is a complaint that carries with a high risk of complications and morbidity.  The differential diagnosis includes ***.  This is not an exhaustive list.  Lab tests: I ordered and personally interpreted labs.  The pertinent results include: WBC unremarkable. Hbg unremarkable. Platelets unremarkable. Electrolytes unremarkable. BUN, creatinine unremarkable. ***  Imaging studies: I ordered imaging studies, personally reviewed, interpreted imaging and agree with the radiologist's interpretations. The results include: ***   Problem list/ ED course/ Critical interventions/ Medical management: HPI: See above Vital signs ***within normal range and stable throughout visit. Laboratory/imaging studies significant for: See above. On physical examination, patient is afebrile and appears in no acute distress. This patient presents with altered mental status, concerning for _. Labs and exam were inconsistent with toxic metabolic etiologies such as electrolyte disturbances (Na/Ca), hypoglycemia, and uremia; acidosis states, infection (i.e. Sepsis). History and exam make toxidromes of intoxication or withdrawal, hypoxemia or hypercarbia, liver disease or failure causing hepatic  encephalopathy, endocrine emergencies (hyper/hypothyroidism, adrenal insufficiency), seizure, trauma, intracranial bleeds or ischemic stroke less likely_.  I have reviewed the patient home medicines and have made adjustments as needed.  Cardiac monitoring/EKG: The patient was maintained on a cardiac monitor.  I personally reviewed and interpreted the cardiac monitor which showed an underlying rhythm of: sinus rhythm.  Additional history obtained: External records from outside source obtained and reviewed including: Chart review including previous notes, labs, imaging.  Consultations obtained:  Disposition Continued outpatient therapy. Follow-up with PCP recommended for reevaluation of symptoms. Treatment plan discussed with patient.  Pt acknowledged understanding was agreeable to the plan. Worrisome signs and symptoms were discussed with patient, and patient acknowledged understanding to return to the ED if they noticed these signs and symptoms. Patient was stable upon discharge.   This chart was dictated using voice recognition software.  Despite best efforts to proofread,  errors can occur which can change the documentation meaning.    {Document critical care time when appropriate:1} {Document review of labs and clinical decision tools ie heart score, Chads2Vasc2 etc:1}  {Document your independent review of radiology images, and any outside records:1} {Document your discussion with family members, caretakers, and with consultants:1} {Document social determinants of health affecting pt's care:1} {Document your decision making why or why not admission, treatments were needed:1} Final Clinical Impression(s) / ED Diagnoses Final diagnoses:  None    Rx / DC Orders ED Discharge Orders     None

## 2023-07-25 NOTE — ED Triage Notes (Signed)
Pt arrives to ED with wife with c/o altered mental status. Per wife she was woken up this morning by pt setting the house alarm off and not remembering the code. Wife also notes pt had forgetting how to feed the dogs. Pt wife states pt has been moaning and slamming hands which is abnormal for pt.   Wife notes LKN 9pm last night. Wife notes pt was complaining of neck pain yesterday.

## 2023-07-25 NOTE — ED Notes (Signed)
Kim at [facility] will send transport for Bed Ready at [facility] 3W RM# 35.-ABB(NS)

## 2023-07-25 NOTE — H&P (Signed)
History and Physical    Gervis Hooter UJW:[id number] DOB: Feb 24, 1951 DOA: 07/25/2023  PCP: Gwenlyn Found, MD   Chief Complaint: Steve Rattler  HPI: Noah Pacheco is a 72 y.o. male with medical history significant of CKD, gout, hyperlipidemia, hypertension who presents emergency department altered mental status.  Patient woke up this morning altered and unable to perform activities of daily living.  He was incoherent and was noted to have a temperature of 99.9.  Patient was brought to the ER for further assessment.  On arrival he was afebrile and hemodynamically stable.  He had a nonfocal neurologic exam.  Labs were obtained which showed creatinine 2.7 baseline alkaline phosphatase 159, lactic acid 1.1, WBC 6.0, hemoglobin 11.0, platelets 144 urine drug screen positive for opiates and benzodiazepines, urinalysis negative for infection.  Patient had chest x-ray which showed no acute findings.  CT head showed hypodensity in right cerebellum with recommended MR follow-up.  Patient was admitted for further workup.  Review of Systems: ROS   As per HPI otherwise 10 point review of systems negative.   Allergies  Allergen Reactions   Oxycodone     Other reaction(s): Mental Status Changes (intolerance) Causes sedation    Past Medical History:  Diagnosis Date   Anemia    Anxiety    Carotid stenosis, right 08/09/2021   CKD (chronic kidney disease)    DDD (degenerative disc disease), cervical    Gout    Hepatic steatosis    Hyperlipidemia    Hypertension    Insomnia    Peripheral neuropathy    Prediabetes     Past Surgical History:  Procedure Laterality Date   KNEE SURGERY Right 2015     reports that he has never smoked. He has never used smokeless tobacco. He reports that he does not currently use alcohol. He reports that he does not currently use drugs.  History reviewed. No pertinent family history.  Prior to Admission medications   Medication Sig Start Date End Date Taking?  Authorizing Provider  allopurinol (ZYLOPRIM) 100 MG tablet Take 100 mg by mouth daily. 04/09/21  Yes [provider]  ALPRAZolam Prudy Feeler) 0.5 MG tablet Take 0.5 mg by mouth at bedtime as needed for anxiety or sleep.   Yes [provider]  amLODipine (NORVASC) 5 MG tablet Take 5 mg by mouth daily. 04/19/21  Yes [provider]  aspirin EC 81 MG tablet Take 1 tablet by mouth daily. 09/10/21  Yes [provider]  atorvastatin (LIPITOR) 80 MG tablet Take 80 mg by mouth daily.   Yes [provider]  celecoxib (CELEBREX) 200 MG capsule Take 200 mg by mouth 2 (two) times daily. 10/11/22  Yes [provider]  enalapril (VASOTEC) 20 MG tablet Take 20 mg by mouth daily.   Yes [provider]  hydrochlorothiazide (HYDRODIURIL) 12.5 MG tablet Take 12.5 mg by mouth daily.   Yes [provider]  Multiple Vitamin (MULTIVITAMIN WITH MINERALS) TABS tablet Take 1 tablet by mouth daily.   Yes [provider]  naloxone (NARCAN) nasal spray 4 mg/0.1 mL Place 1 spray into the nose once. 07/21/23  Yes [provider]  Oxycodone HCl 10 MG TABS Take 10 mg by mouth every 4 (four) hours as needed. (Every 4-6 hours)   Yes [provider]  pregabalin (LYRICA) 75 MG capsule Take 75 mg by mouth 2 (two) times daily.   Yes [provider]  mirtazapine (REMERON) 15 MG tablet Take by mouth. 07/21/21 07/25/23  [provider]  traMADol (ULTRAM) 50 MG tablet Take 1 tablet (50 mg total) by mouth every 6 (six) hours as needed. Patient not taking: Reported on 07/25/2023 10/01/22   Cathren Laine, MD    Physical Exam: Vitals:   07/25/23 1343 07/25/23 1400 07/25/23 1430 07/25/23 2022  BP: 118/61 (!) 107/48 114/69 (!) 139/53  Pulse: 85 80 80 87  Resp: (!) 22 15 14 20   Temp: 99.6 F (37.6 C)   98.7 F (37.1 C)  TempSrc: Oral   Oral  SpO2: 93% 92% 95% 93%  Weight:    108.2 kg  Height:    5\' 10"  (1.778 m)   Physical Exam  ***    Labs on Admission: I have personally reviewed the patients's labs and imaging studies.  Assessment/Plan Principal Problem:   AMS (altered mental status) # Acute encephalopathy most likely due to CVA and/or toxic, POA, active - UDS positive for benzodiazepines and opiates - CT head showed evidence of hypodensity in right cerebellum - No evidence of infection  Plan: Obtain MRI  # Acute kidney injury-likely prerenal in setting of decreased fluid intake due to altered state.  Will plan to fluid resuscitate and check labs in morning  # History of gout-continue allopurinol  # Hypertension-continue amlodipine, hold ACE inhibitor due to AKI  # CKD-continue aspirin  # Hyperlipidemia-continue Lipitor  # Chronic pain-continue Lyrica, as needed hydrocodone.  ***   Admission status: Observation Telemetry Medical  Certification: The appropriate patient status for this patient is OBSERVATION. Observation status is judged to be reasonable and necessary in order to provide the required intensity of service to ensure the patient's safety. The patient's presenting symptoms, physical exam findings, and initial radiographic and laboratory data in the context of their medical condition is felt to place them at decreased risk for further clinical deterioration. Furthermore, it is anticipated that the patient will be medically stable for discharge from the hospital within 2 midnights of admission.     Alan Mulder MD [facility] If 7PM-7AM, please contact night-coverage [email or web address]  07/25/2023, 8:30 PM

## 2023-07-25 NOTE — ED Notes (Signed)
Report attempted x1 to [facility].

## 2023-07-26 DIAGNOSIS — R410 Disorientation, unspecified: Secondary | ICD-10-CM | POA: Diagnosis not present

## 2023-07-26 DIAGNOSIS — G9341 Metabolic encephalopathy: Secondary | ICD-10-CM | POA: Insufficient documentation

## 2023-07-26 DIAGNOSIS — R4182 Altered mental status, unspecified: Secondary | ICD-10-CM | POA: Diagnosis not present

## 2023-07-26 LAB — BASIC METABOLIC PANEL
Anion gap: 10 (ref 5–15)
BUN: 26 mg/dL — ABNORMAL HIGH (ref 8–23)
CO2: 23 mmol/L (ref 22–32)
Calcium: 8.9 mg/dL (ref 8.9–10.3)
Chloride: 104 mmol/L (ref 98–111)
Creatinine, Ser: 1.09 mg/dL (ref 0.61–1.24)
GFR, Estimated: >60 mL/min (ref >60)
Glucose, Bld: 105 mg/dL — ABNORMAL HIGH (ref 70–99)
Potassium: 4 mmol/L (ref 3.5–5.1)
Sodium: 137 mmol/L (ref 135–145)

## 2023-07-26 LAB — CBC
HCT: 36.2 % — ABNORMAL LOW (ref 39.0–52.0)
Hemoglobin: 11.4 g/dL — ABNORMAL LOW (ref 13.0–17.0)
MCH: 28.2 pg (ref 26.0–34.0)
MCHC: 31.5 g/dL (ref 30.0–36.0)
MCV: 89.6 fL (ref 80.0–100.0)
Platelets: 142 10*3/uL — ABNORMAL LOW (ref 150–400)
RBC: 4.04 MIL/uL — ABNORMAL LOW (ref 4.22–5.81)
RDW: 15.7 % — ABNORMAL HIGH (ref 11.5–15.5)
WBC: 4 10*3/uL (ref 4.0–10.5)
nRBC: 0 % (ref 0.0–0.2)

## 2023-07-26 NOTE — Plan of Care (Signed)

## 2023-07-26 NOTE — TOC Transition Note (Signed)
Transition of Care [facility]) - CM/SW Discharge Note   Patient Details  Name: Noah Pacheco MRN: [id number] Date of Birth: [birth year]  [facility] CM/SW Contact:  Kermit Balo, RN Phone Number: 07/26/2023, 12:59 PM   Clinical Narrative:    Pt is discharging home with self care. No needs per TOC.   Final next level of care: Home/Self Care Barriers to Discharge: No Barriers Identified   Patient Goals and CMS Choice      Discharge Placement                         Discharge Plan and Services Additional resources added to the After Visit Summary for                                       Social Determinants of Health (SDOH) Interventions SDOH Screenings   Food Insecurity: No Food Insecurity (07/25/2023)  Housing: Low Risk  (07/25/2023)  Transportation Needs: No Transportation Needs (07/25/2023)  Utilities: Not At Risk (07/25/2023)  Financial Resource Strain: Low Risk  (12/07/2021)   Received from [facility] visits prior to 01/14/2023., [facility] visits prior to 01/14/2023.  Physical Activity: Insufficiently Active (12/07/2021)   Received from [facility] visits prior to 01/14/2023., [facility] visits prior to 01/14/2023.  Social Connections: Unknown (06/20/2022)   Received from [facility]  Stress: Stress Concern Present (12/07/2021)   Received from [facility] visits prior to 01/14/2023., [facility] visits prior to 01/14/2023.  Tobacco Use: Low Risk  (07/25/2023)     Readmission Risk Interventions     No data to display

## 2023-07-26 NOTE — Discharge Summary (Signed)
Physician Discharge Summary  Noah Pacheco VOZ:[id number] DOB: 1950/11/22 DOA: 07/25/2023  PCP: Gwenlyn Found, MD  Admit date: 07/25/2023 Discharge date: 07/26/2023    Admitted From: Home Disposition: Home  Recommendations for Outpatient Follow-up:  Follow up with PCP in 1-2 weeks Please obtain BMP/CBC in one week Please follow up with your PCP on the following pending results: Unresulted Labs (From admission, onward)     Start     Ordered   08/01/23 0500  Creatinine, serum  (enoxaparin (LOVENOX)    CrCl >/= 30 ml/min)  Weekly,   R     Comments: while on enoxaparin therapy    07/25/23 2029              Home Health: None Equipment/Devices: None  Discharge Condition: Stable CODE STATUS: Full code Diet recommendation: Cardiac  Following HPI is copied from admitting hospitalist H&P. HPI: Noah Pacheco is a 72 y.o. male with medical history significant of CKD, gout, hyperlipidemia, hypertension who presents emergency department altered mental status.  Patient woke up this morning altered and unable to perform activities of daily living.  He was incoherent and was noted to have a temperature of 99.9.  Patient was brought to the ER for further assessment.  On arrival he was afebrile and hemodynamically stable.  He had a nonfocal neurologic exam.  Labs were obtained which showed creatinine 2.7 baseline alkaline phosphatase 159, lactic acid 1.1, WBC 6.0, hemoglobin 11.0, platelets 144 urine drug screen positive for opiates and benzodiazepines, urinalysis negative for infection.  Patient had chest x-ray which showed no acute findings.  CT head showed hypodensity in right cerebellum with recommended MR follow-up.  Patient was admitted for further workup.  On evaluation he was slightly disoriented with slurred speech.  MRI was pending.  He denied any infectious complaints.  He states he has Xanax at home however was not taking it from   Subjective: Seen and examined.  Patient fully alert  and oriented.  Wife at the bedside.  He desires to go home.  Brief/Interim Summary: Briefly, patient presented with altered mental status, CT head was negative, UDS was positive for benzodiazepine and opiates.  He also came in with AKI with creatinine of 2.5, previously normal.  Patient underwent MRI of the brain which ruled out acute stroke however it showed "There is multifocal hyperintense T2-weighted signal within the white matter. Parenchymal volume and CSF spaces are normal. There is encephalomalacia of the inferior right cerebellar hemisphere" discussed with neurologist Dr. Pearlean Brownie mentioned that this is not a specific finding and no other workup needs to be done or any follow-up needs to be done.  Patient's AKI has resolved with IV fluids that he received.  Everything is back to normal.  He will resume his home medications and he is being discharged in stable condition.  Discharge plan was discussed with patient and/or family member and they verbalized understanding and agreed with it.  Discharge Diagnoses:  Principal Problem:   AMS (altered mental status) Active Problems:   AKI (acute kidney injury) (HCC)   Acute metabolic encephalopathy    Discharge Instructions   Allergies as of 07/26/2023       Reactions   Oxycodone    Other reaction(s): Mental Status Changes (intolerance) Causes sedation        Medication List     TAKE these medications    allopurinol 100 MG tablet Commonly known as: ZYLOPRIM Take 100 mg by mouth daily.   ALPRAZolam 0.5 MG tablet Commonly  known as: XANAX Take 0.5 mg by mouth at bedtime as needed for anxiety or sleep.   amLODipine 5 MG tablet Commonly known as: NORVASC Take 5 mg by mouth daily.   aspirin EC 81 MG tablet Take 1 tablet by mouth daily.   atorvastatin 80 MG tablet Commonly known as: LIPITOR Take 80 mg by mouth daily.   celecoxib 200 MG capsule Commonly known as: CELEBREX Take 200 mg by mouth 2 (two) times daily.    enalapril 20 MG tablet Commonly known as: VASOTEC Take 20 mg by mouth daily.   hydrochlorothiazide 12.5 MG tablet Commonly known as: HYDRODIURIL Take 12.5 mg by mouth daily.   mirtazapine 15 MG tablet Commonly known as: REMERON Take by mouth.   multivitamin with minerals Tabs tablet Take 1 tablet by mouth daily.   naloxone 4 MG/0.1ML Liqd nasal spray kit Commonly known as: NARCAN Place 1 spray into the nose once.   Oxycodone HCl 10 MG Tabs Take 10 mg by mouth every 4 (four) hours as needed. (Every 4-6 hours)   pregabalin 75 MG capsule Commonly known as: LYRICA Take 75 mg by mouth 2 (two) times daily.   traMADol 50 MG tablet Commonly known as: ULTRAM Take 1 tablet (50 mg total) by mouth every 6 (six) hours as needed.        Follow-up Information     Gwenlyn Found, MD Follow up in 1 week(s).   Specialty: Family Medicine Contact information: [address] [place] [address] [phone]                Allergies  Allergen Reactions   Oxycodone     Other reaction(s): Mental Status Changes (intolerance) Causes sedation    Consultations: None   Procedures/Studies: MR BRAIN WO CONTRAST  Result Date: 07/26/2023 CLINICAL DATA:  Delirium EXAM: MRI HEAD WITHOUT CONTRAST TECHNIQUE: Multiplanar, multiecho pulse sequences of the brain and surrounding structures were obtained without intravenous contrast. COMPARISON:  04/20/2021 FINDINGS: Brain: No acute infarct, mass effect or extra-axial collection. 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 There is multifocal hyperintense T2-weighted signal within the white matter. Parenchymal volume and CSF spaces are normal. There is encephalomalacia of the inferior right cerebellar hemisphere. Vascular: Normal flow voids. Skull and upper cervical spine: Normal marrow signal. Sinuses/Orbits: Bilateral maxillary sinus mucosal thickening. Bilateral ocular lens replacements. Other: None  IMPRESSION: 1. No acute intracranial abnormality. 2. 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 3. Findings of chronic microvascular ischemia. Electronically Signed   By: Deatra Robinson M.D.   On: 07/26/2023 01:32   DG Chest 2 View  Result Date: 07/25/2023 CLINICAL DATA:  Hypoxia and altered mental status. EXAM: CHEST - 2 VIEW COMPARISON:  04/18/2021, 05/15/2011, neck CT 04/20/2021 FINDINGS: Lungs are adequately inflated without focal airspace consolidation or effusion. Sclerotic density associated with the anterior right first rib unchanged. Cardiomediastinal silhouette is within normal. Moderate degenerative changes of the spine. IMPRESSION: No acute cardiopulmonary disease. Electronically Signed   By: Elberta Fortis M.D.   On: 07/25/2023 16:08   CT HEAD WO CONTRAST  Result Date: 07/25/2023 CLINICAL DATA:  Altered mental status EXAM: CT HEAD WITHOUT CONTRAST TECHNIQUE: Contiguous axial images were obtained from the base of the skull through the vertex without intravenous contrast. RADIATION DOSE REDUCTION: This exam was performed according to the departmental dose-optimization program which includes automated exposure control, adjustment of the mA and/or kV according to patient size and/or use  of iterative reconstruction technique. COMPARISON:  05/20/2021 FINDINGS: Brain: Hypodensity in the right cerebellum in the right PICA territory is new from 04/20/2021 but technically age indeterminate. No acute hemorrhage, mass, mass effect, or midline shift. No hydrocephalus or extra-axial collection. Vascular: No hyperdense vessel. Atherosclerotic calcifications in the intracranial carotid and vertebral arteries. Skull: Negative for fracture or focal lesion. Sinuses/Orbits: Mucosal thickening in the left greater than right maxillary sinus and mild mucosal thickening in the ethmoid air cells. Status post bilateral lens replacements. Other:  The mastoid air cells are well aerated. IMPRESSION: 1. Hypodensity in the right cerebellum in the right PICA territory is new from 04/20/2021 but technically age indeterminate. Correlate with symptoms and consider MRI for further evaluation. 2. No other acute intracranial process. Electronically Signed   By: Wiliam Ke M.D.   On: 07/25/2023 16:07     Discharge Exam: Vitals:   07/26/23 0742 07/26/23 1112  BP: (!) 155/71 138/75  Pulse: 82 79  Resp: 17 18  Temp: 98.7 F (37.1 C) 98.3 F (36.8 C)  SpO2: 97% 95%   Vitals:   07/26/23 0029 07/26/23 0535 07/26/23 0742 07/26/23 1112  BP: 123/61 138/67 (!) 155/71 138/75  Pulse: 78 78 82 79  Resp: 20 16 17 18   Temp: 98.4 F (36.9 C) 98.6 F (37 C) 98.7 F (37.1 C) 98.3 F (36.8 C)  TempSrc: Oral Oral Oral Oral  SpO2: 92% 98% 97% 95%  Weight:      Height:        General: Pt is alert, awake, not in acute distress Cardiovascular: RRR, S1/S2 +, no rubs, no gallops Respiratory: CTA bilaterally, no wheezing, no rhonchi Abdominal: Soft, NT, ND, bowel sounds + Extremities: no edema, no cyanosis    The results of significant diagnostics from this hospitalization (including imaging, microbiology, ancillary and laboratory) are listed below for reference.     Microbiology: Recent Results (from the past 240 hour(s))  SARS Coronavirus 2 by RT PCR (hospital order, performed in [facility] hospital lab) *cepheid single result test* Anterior Nasal Swab     Status: None   Collection Time: 07/25/23  2:39 PM   Specimen: Anterior Nasal Swab  Result Value Ref Range Status   SARS Coronavirus 2 by RT PCR NEGATIVE NEGATIVE Final    Comment: (NOTE) SARS-CoV-2 target nucleic acids are NOT DETECTED.  The SARS-CoV-2 RNA is generally detectable in upper and lower respiratory specimens during the acute phase of infection. The lowest concentration of SARS-CoV-2 viral copies this assay can detect is 250 copies / mL. A negative result does not preclude  SARS-CoV-2 infection and should not be used as the sole basis for treatment or other patient management decisions.  A negative result may occur with improper specimen collection / handling, submission of specimen other than nasopharyngeal swab, presence of viral mutation(s) within the areas targeted by this assay, and inadequate number of viral copies (<250 copies / mL). A negative result must be combined with clinical observations, patient history, and epidemiological information.  Fact Sheet for Patients:   RoadLapTop.co.za  Fact Sheet for Healthcare Providers: http://kim-miller.com/  This test is not yet approved or  cleared by the [place] FDA and has been authorized for detection and/or diagnosis of SARS-CoV-2 by FDA under an Emergency Use Authorization (EUA).  This EUA will remain in effect (meaning this test can be used) for the duration of the COVID-19 declaration under Section 564(b)(1) of the Act, 21 U.S.C. section 360bbb-3(b)(1), unless the authorization is terminated or  revoked sooner.  Performed at [facility], [address] [place], [place] [address]      Labs: BNP (last 3 results) No results for input(s): "BNP" in the last 8760 hours. Basic Metabolic Panel: Recent Labs  Lab 07/25/23 1348 07/26/23 0734  NA 138 137  K 4.7 4.0  CL 102 104  CO2 27 23  GLUCOSE 90 105*  BUN 51* 26*  CREATININE 2.70* 1.09  CALCIUM 9.3 8.9   Liver Function Tests: Recent Labs  Lab 07/25/23 1348  AST 18  ALT 18  ALKPHOS 159*  BILITOT 1.0  PROT 7.0  ALBUMIN 4.3   No results for input(s): "LIPASE", "AMYLASE" in the last 168 hours. Recent Labs  Lab 07/25/23 1439  AMMONIA 19   CBC: Recent Labs  Lab 07/25/23 1352 07/26/23 0734  WBC 6.0 4.0  NEUTROABS 3.9  --   HGB 11.0* 11.4*  HCT 34.5* 36.2*  MCV 89.1 89.6  PLT 144* 142*   Cardiac Enzymes: No results for input(s): "CKTOTAL", "CKMB",  "CKMBINDEX", "TROPONINI" in the last 168 hours. BNP: Invalid input(s): "POCBNP" CBG: Recent Labs  Lab 07/25/23 1357  GLUCAP 71   D-Dimer No results for input(s): "DDIMER" in the last 72 hours. Hgb A1c No results for input(s): "HGBA1C" in the last 72 hours. Lipid Profile No results for input(s): "CHOL", "HDL", "LDLCALC", "TRIG", "CHOLHDL", "LDLDIRECT" in the last 72 hours. Thyroid function studies No results for input(s): "TSH", "T4TOTAL", "T3FREE", "THYROIDAB" in the last 72 hours.  Invalid input(s): "FREET3" Anemia work up No results for input(s): "VITAMINB12", "FOLATE", "FERRITIN", "TIBC", "IRON", "RETICCTPCT" in the last 72 hours. Urinalysis    Component Value Date/Time   COLORURINE YELLOW 07/25/2023 1439   APPEARANCEUR CLEAR 07/25/2023 1439   LABSPEC 1.017 07/25/2023 1439   PHURINE 5.0 07/25/2023 1439   GLUCOSEU NEGATIVE 07/25/2023 1439   HGBUR NEGATIVE 07/25/2023 1439   BILIRUBINUR NEGATIVE 07/25/2023 1439   KETONESUR NEGATIVE 07/25/2023 1439   PROTEINUR TRACE (A) 07/25/2023 1439   NITRITE NEGATIVE 07/25/2023 1439   LEUKOCYTESUR NEGATIVE 07/25/2023 1439   Sepsis Labs Recent Labs  Lab 07/25/23 1352 07/26/23 0734  WBC 6.0 4.0   Microbiology Recent Results (from the past 240 hour(s))  SARS Coronavirus 2 by RT PCR (hospital order, performed in [facility] hospital lab) *cepheid single result test* Anterior Nasal Swab     Status: None   Collection Time: 07/25/23  2:39 PM   Specimen: Anterior Nasal Swab  Result Value Ref Range Status   SARS Coronavirus 2 by RT PCR NEGATIVE NEGATIVE Final    Comment: (NOTE) SARS-CoV-2 target nucleic acids are NOT DETECTED.  The SARS-CoV-2 RNA is generally detectable in upper and lower respiratory specimens during the acute phase of infection. The lowest concentration of SARS-CoV-2 viral copies this assay can detect is 250 copies / mL. A negative result does not preclude SARS-CoV-2 infection and should not be used as the sole  basis for treatment or other patient management decisions.  A negative result may occur with improper specimen collection / handling, submission of specimen other than nasopharyngeal swab, presence of viral mutation(s) within the areas targeted by this assay, and inadequate number of viral copies (<250 copies / mL). A negative result must be combined with clinical observations, patient history, and epidemiological information.  Fact Sheet for Patients:   RoadLapTop.co.za  Fact Sheet for Healthcare Providers: http://kim-miller.com/  This test is not yet approved or  cleared by the [place] FDA and has been authorized for detection and/or  diagnosis of SARS-CoV-2 by FDA under an Emergency Use Authorization (EUA).  This EUA will remain in effect (meaning this test can be used) for the duration of the COVID-19 declaration under Section 564(b)(1) of the Act, 21 U.S.C. section 360bbb-3(b)(1), unless the authorization is terminated or revoked sooner.  Performed at [facility], [address] [place], [place] [address]     FURTHER DISCHARGE INSTRUCTIONS:   Get Medicines reviewed and adjusted: Please take all your medications with you for your next visit with your Primary MD   Laboratory/radiological data: Please request your Primary MD to go over all hospital tests and procedure/radiological results at the follow up, please ask your Primary MD to get all Hospital records sent to his/her office.   In some cases, they will be blood work, cultures and biopsy results pending at the time of your discharge. Please request that your primary care M.D. goes through all the records of your hospital data and follows up on these results.   Also Note the following: If you experience worsening of your admission symptoms, develop shortness of breath, life threatening emergency, suicidal or homicidal thoughts you must seek medical  attention immediately by calling 911 or calling your MD immediately  if symptoms less severe.   You must read complete instructions/literature along with all the possible adverse reactions/side effects for all the Medicines you take and that have been prescribed to you. Take any new Medicines after you have completely understood and accpet all the possible adverse reactions/side effects.    Do not drive when taking Pain medications or sleeping medications (Benzodaizepines)   Do not take more than prescribed Pain, Sleep and Anxiety Medications. It is not advisable to combine anxiety,sleep and pain medications without talking with your primary care practitioner   Special Instructions: If you have smoked or chewed Tobacco  in the last 2 yrs please stop smoking, stop any regular Alcohol  and or any Recreational drug use.   Wear Seat belts while driving.   Please note: You were cared for by a hospitalist during your hospital stay. Once you are discharged, your primary care physician will handle any further medical issues. Please note that NO REFILLS for any discharge medications will be authorized once you are discharged, as it is imperative that you return to your primary care physician (or establish a relationship with a primary care physician if you do not have one) for your post hospital discharge needs so that they can reassess your need for medications and monitor your lab values  Time coordinating discharge: Over 30 minutes  SIGNED:   Hughie Closs, MD  [facility] 07/26/2023, 12:36 PM *Please note that this is a verbal dictation therefore any spelling or grammatical errors are due to the "Dragon Medical One" system interpretation. If 7PM-7AM, please contact night-coverage [email or web address]

## 2023-11-15 HISTORY — PX: TOTAL KNEE REVISION: SHX996

## 2023-12-21 ENCOUNTER — Emergency Department (HOSPITAL_BASED_OUTPATIENT_CLINIC_OR_DEPARTMENT_OTHER): Payer: Medicare Other

## 2023-12-21 ENCOUNTER — Other Ambulatory Visit: Payer: Self-pay

## 2023-12-21 ENCOUNTER — Encounter (HOSPITAL_BASED_OUTPATIENT_CLINIC_OR_DEPARTMENT_OTHER): Payer: Self-pay | Admitting: Emergency Medicine

## 2023-12-21 ENCOUNTER — Emergency Department (HOSPITAL_BASED_OUTPATIENT_CLINIC_OR_DEPARTMENT_OTHER)
Admission: EM | Admit: 2023-12-21 | Discharge: 2023-12-21 | Disposition: A | Discharge status: Home / Self Care | Payer: Medicare Other | Attending: Emergency Medicine | Admitting: Emergency Medicine

## 2023-12-21 DIAGNOSIS — Z7982 Long term (current) use of aspirin: Secondary | ICD-10-CM | POA: Insufficient documentation

## 2023-12-21 DIAGNOSIS — I129 Hypertensive chronic kidney disease with stage 1 through stage 4 chronic kidney disease, or unspecified chronic kidney disease: Secondary | ICD-10-CM | POA: Insufficient documentation

## 2023-12-21 DIAGNOSIS — N189 Chronic kidney disease, unspecified: Secondary | ICD-10-CM | POA: Insufficient documentation

## 2023-12-21 DIAGNOSIS — M25561 Pain in right knee: Secondary | ICD-10-CM | POA: Insufficient documentation

## 2023-12-21 DIAGNOSIS — Z79899 Other long term (current) drug therapy: Secondary | ICD-10-CM | POA: Insufficient documentation

## 2023-12-21 DIAGNOSIS — L539 Erythematous condition, unspecified: Secondary | ICD-10-CM | POA: Insufficient documentation

## 2023-12-21 LAB — BASIC METABOLIC PANEL
Anion gap: 7 (ref 5–15)
BUN: 28 mg/dL — ABNORMAL HIGH (ref 8–23)
CO2: 32 mmol/L (ref 22–32)
Calcium: 9.4 mg/dL (ref 8.9–10.3)
Chloride: 97 mmol/L — ABNORMAL LOW (ref 98–111)
Creatinine, Ser: 1.21 mg/dL (ref 0.61–1.24)
GFR, Estimated: >60 mL/min (ref >60)
Glucose, Bld: 96 mg/dL (ref 70–99)
Potassium: 3.9 mmol/L (ref 3.5–5.1)
Sodium: 136 mmol/L (ref 135–145)

## 2023-12-21 LAB — CBC WITH DIFFERENTIAL/PLATELET
Abs Immature Granulocytes: 0.04 10*3/uL (ref 0.00–0.07)
Basophils Absolute: 0 10*3/uL (ref 0.0–0.1)
Basophils Relative: 0 %
Eosinophils Absolute: 0.3 10*3/uL (ref 0.0–0.5)
Eosinophils Relative: 4 %
HCT: 28.9 % — ABNORMAL LOW (ref 39.0–52.0)
Hemoglobin: 9.8 g/dL — ABNORMAL LOW (ref 13.0–17.0)
Immature Granulocytes: 1 %
Lymphocytes Relative: 19 %
Lymphs Abs: 1.2 10*3/uL (ref 0.7–4.0)
MCH: 32.3 pg (ref 26.0–34.0)
MCHC: 33.9 g/dL (ref 30.0–36.0)
MCV: 95.4 fL (ref 80.0–100.0)
Monocytes Absolute: 0.6 10*3/uL (ref 0.1–1.0)
Monocytes Relative: 10 %
Neutro Abs: 4.3 10*3/uL (ref 1.7–7.7)
Neutrophils Relative %: 66 %
Platelets: 181 10*3/uL (ref 150–400)
RBC: 3.03 MIL/uL — ABNORMAL LOW (ref 4.22–5.81)
RDW: 14.9 % (ref 11.5–15.5)
WBC: 6.4 10*3/uL (ref 4.0–10.5)
nRBC: 0 % (ref 0.0–0.2)

## 2023-12-21 MED ORDER — HYDROMORPHONE HCL 1 MG/ML IJ SOLN
0.5000 mg | Freq: Once | INTRAMUSCULAR | Status: AC
  Administered 2023-12-21: 0.5 mg via INTRAVENOUS
  Filled 2023-12-21: qty 1

## 2023-12-21 MED ORDER — HYDROMORPHONE HCL 1 MG/ML IJ SOLN
1.0000 mg | Freq: Once | INTRAMUSCULAR | Status: AC
  Administered 2023-12-21: 1 mg via INTRAVENOUS
  Filled 2023-12-21: qty 1

## 2023-12-21 NOTE — ED Triage Notes (Signed)
 Pt to ED from home c/o right knee and lower leg pain.  Had right knee replacement revision this past Tuesday, had US  done yesterday for swelling and pain in leg with neg DVT.  Here tonight for continued pain.  Took 10mg  oxycodone at home without relief.  Leg red, no obvious heat to leg or drainage through bandage.

## 2023-12-21 NOTE — Discharge Instructions (Signed)
 Continue your current pain regiment as prescribed by your orthopedic surgeon.  Keep your appointment for Tuesday.  You may return to the emergency department sooner for any worsening symptoms.  However, it might be quicker if you report to the [facility] health system.

## 2023-12-21 NOTE — ED Provider Notes (Signed)
 [place] EMERGENCY DEPARTMENT AT [facility] Provider Note   CSN: [id number] Arrival date & time: 12/21/23  0441     History Chief Complaint  Patient presents with   Knee Pain    Noah Pacheco. is a 73 y.o. 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 Viann at [facility] who presents to the emergency department with right knee pain and swelling.  Patient was seen evaluated at urgent care 2 days ago for similar symptoms.  He had an ultrasound of the right leg looking for DVT which was negative.  Pain has been persistent despite taking 10 mg of oxycodone.  He denies any fever or chills.  Does endorse associated erythema and warmth coming from the knee and right lower leg.   Knee Pain      Home Medications Prior to Admission medications   Medication Sig Start Date End Date Taking? Authorizing Provider  allopurinol  (ZYLOPRIM ) 100 MG tablet Take 100 mg by mouth daily. 04/09/21   [provider]  ALPRAZolam  (XANAX ) 0.5 MG tablet Take 0.5 mg by mouth at bedtime as needed for anxiety or sleep.    [provider]  amLODipine  (NORVASC ) 5 MG tablet Take 5 mg by mouth daily. 04/19/21   [provider]  aspirin  EC 81 MG tablet Take 1 tablet by mouth daily. 09/10/21   [provider]  atorvastatin  (LIPITOR ) 80 MG tablet Take 80 mg by mouth daily.    [provider]  celecoxib (CELEBREX) 200 MG capsule Take 200 mg by mouth 2 (two) times daily. 10/11/22   [provider]  enalapril  (VASOTEC ) 20 MG tablet Take 20 mg by mouth daily.    [provider]  hydrochlorothiazide  (HYDRODIURIL ) 12.5 MG tablet Take 12.5 mg by mouth daily.    [provider]  mirtazapine (REMERON) 15 MG tablet Take by mouth. 07/21/21 07/25/23  [provider]  Multiple Vitamin (MULTIVITAMIN WITH MINERALS) TABS tablet Take 1 tablet by mouth daily.    [provider]  naloxone [facility]) nasal spray 4 mg/0.1 mL Place 1 spray into the nose once. 07/21/23   [provider]  Oxycodone HCl 10 MG TABS Take 10 mg by mouth every 4 (four) hours as needed. (Every 4-6 hours)    [provider]  pregabalin  (LYRICA ) 75 MG capsule Take 75 mg by mouth 2 (two) times daily.    [provider]  traMADol  (ULTRAM ) 50 MG tablet Take 1 tablet (50 mg total) by mouth every 6 (six) hours as needed. Patient not taking: Reported on 07/25/2023 10/01/22   Bernard Drivers, MD      Allergies    Oxycodone    Review of Systems   Review of Systems  All other systems reviewed and are negative.   Physical Exam Updated Vital Signs BP 128/61 (BP Location: Right Arm)   Pulse 75   Temp 98.1 F (36.7 C) (Oral)   Resp 16   Ht 5' 10 (1.778 m)   Wt 97.5 kg   SpO2 97%   BMI 30.85 kg/m  Physical Exam Vitals and nursing note reviewed.  Constitutional:      Appearance: Normal appearance.  HENT:     Head: Normocephalic and atraumatic.  Eyes:     General:        Right eye: No discharge.        Left eye: No discharge.     Conjunctiva/sclera: Conjunctivae normal.  Pulmonary:     Effort: Pulmonary effort is normal.  Musculoskeletal:     Comments: 2+ dorsalis pedis pulse felt on the right.  There is some warmth and surrounding erythema to the knee and lower leg.  Skin:    General: Skin is warm and dry.     Findings: No rash.  Neurological:     General: No focal deficit present.     Mental Status: He is alert.  Psychiatric:        Mood and Affect: Mood normal.        Behavior: Behavior normal.     ED Results / Procedures / Treatments   Labs (all labs ordered are listed, but only abnormal results are displayed) Labs Reviewed  CBC WITH DIFFERENTIAL/PLATELET - Abnormal; Notable for the following components:      Result Value   RBC 3.03 (*)    Hemoglobin 9.8 (*)    HCT 28.9 (*)    All other components within normal limits  BASIC  METABOLIC PANEL - Abnormal; Notable for the following components:   Chloride 97 (*)    BUN 28 (*)    All other components within normal limits    EKG None  Radiology CT Knee Right Wo Contrast Result Date: 12/21/2023 CLINICAL DATA:  Postoperative EXAM: CT OF THE RIGHT KNEE WITHOUT CONTRAST TECHNIQUE: Multidetector CT imaging of the right knee was performed according to the standard protocol. Multiplanar CT image reconstructions were also generated. RADIATION DOSE REDUCTION: This exam was performed according to the departmental dose-optimization program which includes automated exposure control, adjustment of the mA and/or kV according to patient size and/or use of iterative reconstruction technique. COMPARISON:  Right knee radiographs 10/01/2022, MRI right knee 02/28/2022 FINDINGS: Bones/Joint/Cartilage Interval revision of the prior medial unicompartmental arthroplasty, now a total right knee arthroplasty. Associated streak artifact limits evaluation of fine bony detail. No definite perihardware lucency is seen to indicate hardware failure or loosening. There is low-density likely from postsurgical changes within the distal femoral metadiaphysis superior to the metallic femoral prosthesis. There is a tiny focus of low-density may represent fat or air within this postsurgical change (axial series 3, image 29 and coronal series 6, image 35), incidentally noted. No acute fracture is seen. Mild chronic enthesopathic change at the quadriceps insertion on the patella. Mild superior patellar degenerative spurring. Ligaments Suboptimally assessed by CT. Muscles and Tendons Normal density and size of the regional musculature. No gross tendon tear is seen. Soft tissues There is a moderate-to-large joint effusion with internal density measuring 38-45 Hounsfield units, consistent with internal blood products. No nondependent fat layer. This is compatible with recent postsurgical change. Recommend clinical correlation.  There is a surgical incision/scar noted along the anterior knee from the level of the distal femoral diaphysis through the proximal tibial metadiaphysis. Moderate diffuse subcutaneous fat edema and swelling along with mild skin thickening, greatest within the anterior and lateral aspect of the knee. IMPRESSION: 1. Interval revision of the prior medial unicompartmental arthroplasty, now a total right knee arthroplasty. No definite perihardware lucency is seen to indicate hardware failure or loosening. 2. Moderate-to-large joint effusion with internal density measuring 38-45 Hounsfield units, consistent with internal blood products. This is compatible with recent postsurgical change. Recommend clinical correlation. 3. Moderate diffuse subcutaneous fat edema and swelling along with mild skin thickening, greatest within the anterior and lateral aspect of the knee. Electronically Signed   By: Tanda Lyons M.D.   On: 12/21/2023 12:01    Procedures Procedures  Medications Ordered in ED Medications  HYDROmorphone  (DILAUDID ) injection 1 mg (has no administration in time range)  HYDROmorphone  (DILAUDID ) injection 0.5 mg (0.5 mg Intravenous Given 12/21/23 1034)    ED Course/ Medical Decision Making/ A&P Clinical Course as of 12/21/23 1321  Thu Dec 21, 2023  1318 I attempted to call Dr. Rosan office at [facility] 3 times and of left 3 voicemails.  Have yet to hear back. [CF]  1318 CBC with Differential(!) No evidence of leukocytosis. [CF]  1318 Basic metabolic panel(!) Mild hypochloremia.  Otherwise normal. [CF]  1318 CT Knee Right Wo Contrast I personally ordered and interpreted the study and do not see any evidence of septic joint.  I do agree with the radiologist's interpretation. [CF]    Clinical Course User Index [CF] Theotis Cameron HERO, PA-C   {   Click here for ABCD2, HEART and other calculators  Medical Decision Making Noah Pacheco. is a 73 y.o. male patient who presents to the emergency  department today for further evaluation of right knee pain status post right knee revision.  Will get some basic labs as it does appear to be erythematous and warm to touch.  Does have a strong dorsalis pedis pulse felt on the right.  Good cap refill distally.  Will also get a CT image considering that he is so close postop wise.  Will likely touch base with the orthopedic surgeon after get some of the results back.  Will also give him some pain medication as well.  Vital signs are normal at this time.  I have a low suspicion for septic joint.  CT was negative for any new or sinister pathology.  No evidence of leukocytosis.  Pain controlled here with Dilaudid .  He has an appointment with his orthopedic surgeon on Tuesday.  He was encouraged to keep his appointment.  Strict turn precaution were discussed.  He is safe for discharge at this time.  Amount and/or Complexity of Data Reviewed Labs: ordered. Decision-making details documented in ED Course. Radiology: ordered. Decision-making details documented in ED Course.  Risk Prescription drug management.    Final Clinical Impression(s) / ED Diagnoses Final diagnoses:  Acute pain of right knee    Rx / DC Orders ED Discharge Orders     None         Theotis Cameron HERO, [place] 12/21/23 1321    Geraldene Hamilton, MD 12/22/23 [phone]

## 2024-07-05 ENCOUNTER — Emergency Department (HOSPITAL_BASED_OUTPATIENT_CLINIC_OR_DEPARTMENT_OTHER)
Admission: EM | Admit: 2024-07-05 | Discharge: 2024-07-05 | Disposition: A | Discharge status: Home / Self Care | Attending: Emergency Medicine | Admitting: Emergency Medicine

## 2024-07-05 ENCOUNTER — Other Ambulatory Visit: Payer: Self-pay

## 2024-07-05 ENCOUNTER — Other Ambulatory Visit (HOSPITAL_BASED_OUTPATIENT_CLINIC_OR_DEPARTMENT_OTHER): Payer: Self-pay

## 2024-07-05 ENCOUNTER — Emergency Department (HOSPITAL_BASED_OUTPATIENT_CLINIC_OR_DEPARTMENT_OTHER)

## 2024-07-05 DIAGNOSIS — N189 Chronic kidney disease, unspecified: Secondary | ICD-10-CM | POA: Diagnosis not present

## 2024-07-05 DIAGNOSIS — W228XXA Striking against or struck by other objects, initial encounter: Secondary | ICD-10-CM | POA: Diagnosis not present

## 2024-07-05 DIAGNOSIS — Z23 Encounter for immunization: Secondary | ICD-10-CM | POA: Insufficient documentation

## 2024-07-05 DIAGNOSIS — Z7982 Long term (current) use of aspirin: Secondary | ICD-10-CM | POA: Diagnosis not present

## 2024-07-05 DIAGNOSIS — I129 Hypertensive chronic kidney disease with stage 1 through stage 4 chronic kidney disease, or unspecified chronic kidney disease: Secondary | ICD-10-CM | POA: Diagnosis not present

## 2024-07-05 DIAGNOSIS — Z79899 Other long term (current) drug therapy: Secondary | ICD-10-CM | POA: Insufficient documentation

## 2024-07-05 DIAGNOSIS — R7303 Prediabetes: Secondary | ICD-10-CM | POA: Insufficient documentation

## 2024-07-05 DIAGNOSIS — S61214A Laceration without foreign body of right ring finger without damage to nail, initial encounter: Secondary | ICD-10-CM | POA: Insufficient documentation

## 2024-07-05 DIAGNOSIS — S6991XA Unspecified injury of right wrist, hand and finger(s), initial encounter: Secondary | ICD-10-CM | POA: Diagnosis present

## 2024-07-05 DIAGNOSIS — S61219A Laceration without foreign body of unspecified finger without damage to nail, initial encounter: Secondary | ICD-10-CM

## 2024-07-05 MED ORDER — ONDANSETRON HCL 4 MG/2ML IJ SOLN
4.0000 mg | Freq: Once | INTRAMUSCULAR | Status: AC
  Administered 2024-07-05: 4 mg via INTRAVENOUS
  Filled 2024-07-05: qty 2

## 2024-07-05 MED ORDER — CEPHALEXIN 500 MG PO CAPS
500.0000 mg | ORAL_CAPSULE | Freq: Four times a day (QID) | ORAL | 0 refills | Status: AC
  Filled 2024-07-05: qty 28, 7d supply, fill #0

## 2024-07-05 MED ORDER — LIDOCAINE-EPINEPHRINE (PF) 2 %-1:200000 IJ SOLN
20.0000 mL | Freq: Once | INTRAMUSCULAR | Status: AC
  Administered 2024-07-05: 20 mL
  Filled 2024-07-05: qty 20

## 2024-07-05 MED ORDER — TETANUS-DIPHTH-ACELL PERTUSSIS 5-2.5-18.5 LF-MCG/0.5 IM SUSY
0.5000 mL | PREFILLED_SYRINGE | Freq: Once | INTRAMUSCULAR | Status: AC
  Administered 2024-07-05: 0.5 mL via INTRAMUSCULAR
  Filled 2024-07-05: qty 0.5

## 2024-07-05 MED ORDER — HYDROMORPHONE HCL 1 MG/ML IJ SOLN
1.0000 mg | Freq: Once | INTRAMUSCULAR | Status: DC
  Filled 2024-07-05: qty 1

## 2024-07-05 MED ORDER — HYDROMORPHONE HCL 1 MG/ML IJ SOLN
0.5000 mg | Freq: Once | INTRAMUSCULAR | Status: AC
  Administered 2024-07-05: 0.5 mg via INTRAVENOUS
  Filled 2024-07-05: qty 1

## 2024-07-05 MED ORDER — HYDROMORPHONE HCL 1 MG/ML IJ SOLN
0.5000 mg | Freq: Once | INTRAMUSCULAR | Status: AC
  Administered 2024-07-05: 0.5 mg via INTRAVENOUS

## 2024-07-05 MED ORDER — CEFAZOLIN SODIUM-DEXTROSE 2-4 GM/100ML-% IV SOLN
2.0000 g | Freq: Once | INTRAVENOUS | Status: AC
  Administered 2024-07-05: 2 g via INTRAVENOUS
  Filled 2024-07-05: qty 100

## 2024-07-05 NOTE — ED Notes (Addendum)
 Cleaned and irrigated lacerations on right hand with iodine and normal saline. Pt tolerated well.

## 2024-07-05 NOTE — ED Provider Notes (Addendum)
.  Laceration Repair  Date/Time: 07/05/2024 4:31 PM  Performed by: Lang Norleen POUR, PA-C Authorized by: Lang Norleen POUR, PA-C   Consent:    Consent obtained:  Verbal   Consent given by:  Patient   Risks discussed:  Infection, need for additional repair, poor cosmetic result and retained foreign body   Alternatives discussed:  No treatment Universal protocol:    Procedure explained and questions answered to patient or proxy's satisfaction: yes     Relevant documents present and verified: yes     Patient identity confirmed:  Verbally with patient and hospital-assigned identification number Anesthesia:    Anesthesia method:  Local infiltration   Local anesthetic:  Lidocaine  1% WITH epi Laceration details:    Location:  Finger   Finger location: Lacerations to all 5 fingers about the palmar aspect of the right hand.   Length (cm):  15 Pre-procedure details:    Preparation:  Patient was prepped and draped in usual sterile fashion Exploration:    Limited defect created (wound extended): no     Hemostasis achieved with:  Direct pressure   Imaging obtained: x-ray     Imaging outcome: foreign body not noted     Wound exploration: wound explored through full range of motion     Wound extent: areolar tissue violated     Contaminated: no   Treatment:    Area cleansed with:  Povidone-iodine and saline   Amount of cleaning:  Extensive   Irrigation solution:  Sterile saline   Irrigation volume:  1000 ml   Irrigation method:  Pressure wash   Debridement:  None   Undermining:  None Skin repair:    Repair method:  Sutures   Suture size:  4-0   Suture material:  Nylon   Suture technique:  Simple interrupted   Number of sutures:  24 Approximation:    Approximation:  Close Repair type:    Repair type:  Complex Post-procedure details:    Dressing:  Non-adherent dressing   Procedure completion:  Tolerated well, no immediate complications Comments:     Applied nonadherent dressings and  finger static splints for protection.       Lang Norleen POUR, PA-C 07/05/24 1634    Patsey Lot, MD 07/06/24 2250

## 2024-07-05 NOTE — ED Provider Notes (Signed)
 [place] EMERGENCY DEPARTMENT AT [facility] Provider Note   CSN: [id number] Arrival date & time: 07/05/24  1155     Patient presents with: Extremity Laceration (Right hand)   Noah Pacheco. is a 73 y.o. male.   HPI Patient with right hand laceration.  Reportedly hit with a weed [place].  Unknown last tetanus.  Still able to move the hand but does having pain.  Rinsed out some at home.   Past Medical History:  Diagnosis Date   Anemia    Anxiety    Carotid stenosis, right 08/09/2021   CKD (chronic kidney disease)    DDD (degenerative disc disease), cervical    Gout    Hepatic steatosis    Hyperlipidemia    Hypertension    Insomnia    Peripheral neuropathy    Prediabetes     Prior to Admission medications   Medication Sig Start Date End Date Taking? Authorizing Provider  cephALEXin  (KEFLEX ) 500 MG capsule Take 1 capsule (500 mg total) by mouth 4 (four) times daily. 07/05/24  Yes Patsey Lot, MD  Vitamin D, Ergocalciferol, (DRISDOL) 1.25 MG (50000 UNIT) CAPS capsule Take 50,000 Units by mouth once a week. 04/15/24 04/15/25 Yes [provider]  allopurinol  (ZYLOPRIM ) 100 MG tablet Take 100 mg by mouth daily. 04/09/21   [provider]  ALPRAZolam  (XANAX ) 0.5 MG tablet Take 0.5 mg by mouth at bedtime as needed for anxiety or sleep.    [provider]  amLODipine  (NORVASC ) 5 MG tablet Take 5 mg by mouth daily. 04/19/21   [provider]  aspirin  EC 81 MG tablet Take 1 tablet by mouth daily. 09/10/21   [provider]  atorvastatin  (LIPITOR ) 80 MG tablet Take 80 mg by mouth daily.    [provider]  celecoxib (CELEBREX) 200 MG capsule Take 200 mg by mouth 2 (two) times daily. 10/11/22   [provider]  enalapril  (VASOTEC ) 20 MG tablet Take 20 mg by mouth daily.    [provider]  hydrochlorothiazide  (HYDRODIURIL ) 12.5 MG tablet Take 12.5 mg by mouth daily.    [provider]   indomethacin (INDOCIN) 25 MG capsule Take by mouth.    [provider]  mirtazapine (REMERON) 15 MG tablet Take by mouth. 07/21/21 07/25/23  [provider]  MOUNJARO 15 MG/0.5ML Pen Inject 15 mg into the skin once a week.    [provider]  Multiple Vitamin (MULTIVITAMIN WITH MINERALS) TABS tablet Take 1 tablet by mouth daily.    [provider]  naloxone [facility]) nasal spray 4 mg/0.1 mL Place 1 spray into the nose once. 07/21/23   [provider]  Oxycodone HCl 10 MG TABS Take 10 mg by mouth every 4 (four) hours as needed. (Every 4-6 hours)    [provider]  oxyCODONE-acetaminophen  (PERCOCET) 10-325 MG tablet Take 1 tablet by mouth 5 (five) times daily as needed.    [provider]  pregabalin  (LYRICA ) 75 MG capsule Take 75 mg by mouth 2 (two) times daily.    [provider]  traMADol  (ULTRAM ) 50 MG tablet Take 1 tablet (50 mg total) by mouth every 6 (six) hours as needed. Patient not taking: Reported on 07/25/2023 10/01/22   Steinl, Kevin, MD    Allergies: Patient has no active allergies.    Review of Systems  Updated Vital Signs BP 130/61   Pulse 65   Temp 98.1 F (36.7 C) (Temporal)   Resp 20   Ht 5'  10 (1.778 m)   Wt 97.5 kg   SpO2 95%   BMI 30.84 kg/m   Physical Exam Vitals and nursing note reviewed.  Cardiovascular:     Rate and Rhythm: Regular rhythm.  Musculoskeletal:     Comments: Lacerations along multiple fingers on the palmar aspect of the right hand.  Still able to flex and extend.  Sensation grossly intact.  Neurological:     Mental Status: He is alert.        (all labs ordered are listed, but only abnormal results are displayed) Labs Reviewed - No data to display  EKG: None  Radiology: DG Hand Complete Right Result Date: 07/05/2024 CLINICAL DATA:  We had black or injury.  Laceration. EXAM: DG HAND COMPLETE 3+V*R* COMPARISON:  None Available. FINDINGS: There is no evidence of  fracture or dislocation. 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 A dressing overlies the digit. No radiopaque foreign body. IMPRESSION: No fracture or subluxation of the right hand. No radiopaque foreign body. Electronically Signed   By: Andrea Gasman M.D.   On: 07/05/2024 14:24     Procedures   Medications Ordered in the ED  HYDROmorphone  (DILAUDID ) injection 1 mg (has no administration in time range)  ceFAZolin  (ANCEF ) IVPB 2g/100 mL premix (0 g Intravenous Stopped 07/05/24 1458)  HYDROmorphone  (DILAUDID ) injection 0.5 mg (0.5 mg Intravenous Given 07/05/24 1420)  ondansetron  (ZOFRAN ) injection 4 mg (4 mg Intravenous Given 07/05/24 1420)  lidocaine -EPINEPHrine  (XYLOCAINE  W/EPI) 2 %-1:200000 (PF) injection 20 mL (20 mLs Infiltration Given 07/05/24 1445)  Tdap (BOOSTRIX ) injection 0.5 mL (0.5 mLs Intramuscular Given 07/05/24 1446)                                    Medical Decision Making Amount and/or Complexity of Data Reviewed Radiology: ordered.  Risk Prescription drug management.   Patient with lacerations to all the fingers and thumb of right hand.  Does not appear to have clear ligamentous involvement.  Bony involvement also felt less likely.  Had been given pain meds and antibiotics.  Will close wound after irrigation.   X-ray reassuring.  Wound closed.  Will discharge home.  Follow-up with hand surgery.  Antibiotics given.      Final diagnoses:  Laceration of finger of right hand without foreign body without damage to nail, unspecified finger, initial encounter    ED Discharge Orders          Ordered    cephALEXin  (KEFLEX ) 500 MG capsule  4 times daily        07/05/24 1446               Patsey Lot, MD 07/05/24 1542

## 2024-07-05 NOTE — Discharge Instructions (Addendum)
 Sutured repair Keep the laceration site dry for the next 24 hours and leave the dressing in place. After 24 hours you may remove the dressing and gently clean the laceration site with antibacterial soap and warm water. Do not scrub the area. Do not soak the area and water for long periods of time. Don't use hydrogen peroxide, iodine-based solutions, or alcohol, which can slow healing, and will probably be painful! Apply topical bacitracin 1-2 times per day for the next 3-5 days. Please follow up with hand surgery in clinic. You should return sooner for any signs of infection which would include increased redness around the wound, increased swelling, new drainage of yellow pus.   Please schedule an appointment with hand surgery soon as possible.  Also sent Keflex  to your pharmacy.

## 2024-07-05 NOTE — ED Triage Notes (Signed)
 Patient arrives POV with complaints of lacerating his right hand with a weed-trimmer today. Unknown tetanus.

## 2024-07-05 NOTE — ED Notes (Addendum)
 All 5 fingers on right hand dressed with bactrim ointment, non-stick pad and co-band. Finger splints applied. Pt tolerated well.

## 2024-07-05 NOTE — ED Notes (Signed)
 ED Provider at bedside.
